# Patient Record
Sex: Female | Born: 1970 | Race: White | Hispanic: No | Marital: Married | Smoking: Never smoker
Health system: Southern US, Community
[De-identification: ages and names within clinical notes are randomized; demographics above are authoritative.]

## PROBLEM LIST (undated history)

## (undated) DIAGNOSIS — Z87442 Personal history of urinary calculi: Secondary | ICD-10-CM

## (undated) HISTORY — PX: CYSTOSCOPY W/ URETEROSCOPY: SUR379

---

## 1997-08-18 ENCOUNTER — Inpatient Hospital Stay (HOSPITAL_COMMUNITY): Admission: AD | Admit: 1997-08-18 | Discharge: 1997-08-20 | Payer: Self-pay | Admitting: Obstetrics and Gynecology

## 1997-10-08 ENCOUNTER — Other Ambulatory Visit: Admission: RE | Admit: 1997-10-08 | Discharge: 1997-10-08 | Payer: Self-pay | Admitting: *Deleted

## 1999-08-06 ENCOUNTER — Other Ambulatory Visit: Admission: RE | Admit: 1999-08-06 | Discharge: 1999-08-06 | Payer: Self-pay | Admitting: Obstetrics and Gynecology

## 2000-03-01 ENCOUNTER — Inpatient Hospital Stay (HOSPITAL_COMMUNITY): Admission: AD | Admit: 2000-03-01 | Discharge: 2000-03-03 | Payer: Self-pay | Admitting: Obstetrics and Gynecology

## 2000-04-10 ENCOUNTER — Other Ambulatory Visit: Admission: RE | Admit: 2000-04-10 | Discharge: 2000-04-10 | Payer: Self-pay | Admitting: Obstetrics and Gynecology

## 2001-06-18 ENCOUNTER — Other Ambulatory Visit: Admission: RE | Admit: 2001-06-18 | Discharge: 2001-06-18 | Payer: Self-pay | Admitting: Obstetrics and Gynecology

## 2002-08-05 ENCOUNTER — Emergency Department (HOSPITAL_COMMUNITY): Admission: EM | Admit: 2002-08-05 | Discharge: 2002-08-05 | Payer: Self-pay | Admitting: Emergency Medicine

## 2002-08-05 ENCOUNTER — Encounter: Payer: Self-pay | Admitting: Emergency Medicine

## 2002-08-07 ENCOUNTER — Emergency Department (HOSPITAL_COMMUNITY): Admission: EM | Admit: 2002-08-07 | Discharge: 2002-08-07 | Payer: Self-pay | Admitting: Emergency Medicine

## 2002-08-13 ENCOUNTER — Encounter: Payer: Self-pay | Admitting: Urology

## 2002-08-13 ENCOUNTER — Ambulatory Visit (HOSPITAL_COMMUNITY): Admission: RE | Admit: 2002-08-13 | Discharge: 2002-08-13 | Payer: Self-pay | Admitting: Urology

## 2002-12-25 ENCOUNTER — Other Ambulatory Visit: Admission: RE | Admit: 2002-12-25 | Discharge: 2002-12-25 | Payer: Self-pay | Admitting: Obstetrics and Gynecology

## 2004-07-30 ENCOUNTER — Other Ambulatory Visit: Admission: RE | Admit: 2004-07-30 | Discharge: 2004-07-30 | Payer: Self-pay | Admitting: Obstetrics and Gynecology

## 2009-03-07 HISTORY — PX: EXTRACORPOREAL SHOCK WAVE LITHOTRIPSY: SHX1557

## 2009-09-23 ENCOUNTER — Encounter: Admission: RE | Admit: 2009-09-23 | Discharge: 2009-09-23 | Payer: Self-pay | Admitting: Obstetrics and Gynecology

## 2010-07-23 NOTE — Op Note (Signed)
NAME:  Stephanie Mcclure, Stephanie Mcclure                         ACCOUNT NO.:  [id number]   MEDICAL RECORD NO.:  [id number]                   PATIENT TYPE:  AMB   LOCATION:  DAY                                  FACILITY:  [facility]   PHYSICIAN:  Excell Seltzer. Annabell Howells, M.D.                 DATE OF BIRTH:  [birth year]   DATE OF PROCEDURE:  08/13/2002  DATE OF DISCHARGE:                                 OPERATIVE REPORT   PREOPERATIVE DIAGNOSIS:  Left ureterovesical junction stone.   POSTOPERATIVE DIAGNOSIS:  Left ureterovesical junction stone.   PROCEDURES:  Cystoscopy with left retrograde pyelogram with interpretation  and left ureteroscopic stent extraction.   SURGEON:  Excell Seltzer. Annabell Howells, M.D.   ANESTHESIA:  General.   SPECIMENS:  Stone.   COMPLICATIONS:  None.   INDICATIONS:  [name] is a 40 year old white female with a 6 mm left distal  ureteral stone that has failed to pass.  She has had persistent pain and has  elected ureteroscopic removal.   FINDINGS AND PROCEDURE:  The patient was taken to the operating room, where  a general anesthetic was induced.  She had been given p.o. antibiotics  preoperatively.  She was placed in the lithotomy position, her perineum and  genitalia were prepped with Betadine solution, and she was draped in the  usual sterile fashion.  Cystoscopy was performed using a 22 [place] scope and  a 70 degree lens.  Examination revealed a normal bladder wall.  The ureteral  orifices were unremarkable.  The 12 degree lens was then placed along with a  5 French opening catheter.  Contrast was instilled in a retrograde fashion.  This demonstrated persistent calcification in the distal ureter on the left.  I had originally not been able to see it on fluoroscopy, but it was readily  visible on the retrograde.  There was some mild dilation proximal.  I then  removed the cystoscope and passed a 6 French long ureteroscope.  This was  easily advanced through the ureteral orifice without dilation.   The stone  was visualized, grasped with a nitinol basket, and removed without  difficulty.  Reinspection of the ureter after removal of the stone revealed  no significant trauma or inflammatory changes.  The bladder was then  drained.  The patient was taken down from lithotomy position.  She was given  30 mg of Toradol IV and her anesthetic was reversed.  She was moved to the  recovery room in stable condition.  There were no complications.                                                Excell Seltzer. Annabell Howells, M.D.    JJW/MEDQ  D:  08/13/2002  T:  08/13/2002  Job:  [id number]

## 2011-08-02 ENCOUNTER — Encounter (HOSPITAL_COMMUNITY): Payer: Self-pay

## 2011-08-02 ENCOUNTER — Emergency Department (HOSPITAL_COMMUNITY): Payer: BC Managed Care – PPO

## 2011-08-02 ENCOUNTER — Emergency Department (HOSPITAL_COMMUNITY)
Admission: EM | Admit: 2011-08-02 | Discharge: 2011-08-03 | Disposition: A | Discharge status: Home / Self Care | Payer: BC Managed Care – PPO | Attending: Emergency Medicine | Admitting: Emergency Medicine

## 2011-08-02 DIAGNOSIS — N201 Calculus of ureter: Secondary | ICD-10-CM | POA: Insufficient documentation

## 2011-08-02 DIAGNOSIS — R109 Unspecified abdominal pain: Secondary | ICD-10-CM | POA: Insufficient documentation

## 2011-08-02 DIAGNOSIS — N200 Calculus of kidney: Secondary | ICD-10-CM | POA: Insufficient documentation

## 2011-08-02 LAB — URINALYSIS, ROUTINE W REFLEX MICROSCOPIC
Bilirubin Urine: NEGATIVE
Nitrite: NEGATIVE
Protein, ur: NEGATIVE mg/dL
Urobilinogen, UA: 0.2 mg/dL (ref 0.0–1.0)

## 2011-08-02 LAB — URINE MICROSCOPIC-ADD ON

## 2011-08-02 MED ORDER — ONDANSETRON HCL 4 MG/2ML IJ SOLN
4.0000 mg | Freq: Once | INTRAMUSCULAR | Status: AC
  Administered 2011-08-03: 4 mg via INTRAVENOUS
  Filled 2011-08-02: qty 2

## 2011-08-02 MED ORDER — HYDROMORPHONE HCL PF 1 MG/ML IJ SOLN
1.0000 mg | Freq: Once | INTRAMUSCULAR | Status: AC
  Administered 2011-08-03: 1 mg via INTRAVENOUS
  Filled 2011-08-02: qty 1

## 2011-08-02 MED ORDER — SODIUM CHLORIDE 0.9 % IV SOLN
INTRAVENOUS | Status: DC
  Administered 2011-08-03: via INTRAVENOUS

## 2011-08-02 NOTE — ED Notes (Signed)
Pt had acute pain on her left side, no hematuria, hx of kidney stones 10 years ago

## 2011-08-03 MED ORDER — ONDANSETRON HCL 4 MG PO TABS: 4.0000 mg | ORAL_TABLET | Freq: Four times a day (QID) | ORAL | Status: AC

## 2011-08-03 MED ORDER — OXYCODONE-ACETAMINOPHEN 5-325 MG PO TABS: 2.0000 | ORAL_TABLET | ORAL | Status: AC | PRN

## 2011-08-03 NOTE — ED Notes (Signed)
Nadn.  Pt aaox3.  Pt cverbalizes understanding.  Pt ambulate without difficulty

## 2011-08-03 NOTE — Discharge Instructions (Signed)
Kidney Stones Kidney stones (ureteral lithiasis) are deposits that form inside your kidneys. The intense pain is caused by the stone moving through the urinary tract. When the stone moves, the ureter goes into spasm around the stone. The stone is usually passed in the urine.  CAUSES   A disorder that makes certain neck glands produce too much parathyroid hormone (primary hyperparathyroidism).   A buildup of uric acid crystals.   Narrowing (stricture) of the ureter.   A kidney obstruction present at birth (congenital obstruction).   Previous surgery on the kidney or ureters.   Numerous kidney infections.  SYMPTOMS   Feeling sick to your stomach (nauseous).   Throwing up (vomiting).   Blood in the urine (hematuria).   Pain that usually spreads (radiates) to the groin.   Frequency or urgency of urination.  DIAGNOSIS   Taking a history and physical exam.   Blood or urine tests.   Computerized X-ray scan (CT scan).   Occasionally, an examination of the inside of the urinary bladder (cystoscopy) is performed.  TREATMENT   Observation.   Increasing your fluid intake.   Surgery may be needed if you have severe pain or persistent obstruction.  The size, location, and chemical composition are all important variables that will determine the proper choice of action for you. Talk to your caregiver to better understand your situation so that you will minimize the risk of injury to yourself and your kidney.  HOME CARE INSTRUCTIONS   Drink enough water and fluids to keep your urine clear or pale yellow.   Strain all urine through the provided strainer. Keep all particulate matter and stones for your caregiver to see. The stone causing the pain may be as small as a grain of salt. It is very important to use the strainer each and every time you pass your urine. The collection of your stone will allow your caregiver to analyze it and verify that a stone has actually passed.   Only take  over-the-counter or prescription medicines for pain, discomfort, or fever as directed by your caregiver.   Make a follow-up appointment with your caregiver as directed.   Get follow-up X-rays if required. The absence of pain does not always mean that the stone has passed. It may have only stopped moving. If the urine remains completely obstructed, it can cause loss of kidney function or even complete destruction of the kidney. It is your responsibility to make sure X-rays and follow-ups are completed. Ultrasounds of the kidney can show blockages and the status of the kidney. Ultrasounds are not associated with any radiation and can be performed easily in a matter of minutes.  SEEK IMMEDIATE MEDICAL CARE IF:   Pain cannot be controlled with the prescribed medicine.   You have a fever.   The severity or intensity of pain increases over 18 hours and is not relieved by pain medicine.   You develop a new onset of abdominal pain.   You feel faint or pass out.  MAKE SURE YOU:   Understand these instructions.   Will watch your condition.

## 2011-08-03 NOTE — ED Provider Notes (Signed)
History     CSN: [id number]  Arrival date & time 08/02/11  1941   First MD Initiated Contact with Patient 08/02/11 2324      Chief Complaint  Patient presents with  . Flank Pain    (Consider location/radiation/quality/duration/timing/severity/associated sxs/prior treatment) HPI History provided by patient. Left flank pain onset tonight. Sudden onset severe pain. No radiation. Sharp in quality. No hematuria. History of same years ago requiring surgery for kidney stone. No trauma. No rash. No chest pain or shortness of breath. No abdominal pain otherwise. Pain lasted about an hour and by the time she arrives to the emergency department is resolved. No known aggravating or alleviating factors. History reviewed. No pertinent past medical history.  History reviewed. No pertinent past surgical history.  History reviewed. No pertinent family history.  History  Substance Use Topics  . Smoking status: Not on file  . Smokeless tobacco: Not on file  . Alcohol Use: No    OB History    Grav Para Term Preterm Abortions TAB SAB Ect Mult Living                  Review of Systems  Constitutional: Negative for fever and chills.  HENT: Negative for neck pain and neck stiffness.   Eyes: Negative for pain.  Respiratory: Negative for shortness of breath.   Cardiovascular: Negative for chest pain.  Gastrointestinal: Negative for abdominal pain.  Genitourinary: Positive for flank pain. Negative for dysuria.  Musculoskeletal: Negative for back pain.  Skin: Negative for rash.  Neurological: Negative for headaches.  All other systems reviewed and are negative.    Allergies  Review of patient's allergies indicates no known allergies.  Home Medications  No current outpatient prescriptions on file.  BP 107/70  Pulse 81  Temp(Src) 98.4 F (36.9 C) (Oral)  Resp 20  Wt 135 lb (61.236 kg)  SpO2 100%  Physical Exam  Constitutional: She is oriented to person, place, and time. She  appears well-developed and well-nourished.  HENT:  Head: Normocephalic and atraumatic.  Eyes: Conjunctivae and EOM are normal. Pupils are equal, round, and reactive to light.  Neck: Trachea normal. Neck supple. No thyromegaly present.  Cardiovascular: Normal rate, regular rhythm, S1 normal, S2 normal and normal pulses.     No systolic murmur is present   No diastolic murmur is present  Pulses:      Radial pulses are 2+ on the right side, and 2+ on the left side.  Pulmonary/Chest: Effort normal and breath sounds normal. She has no wheezes. She has no rhonchi. She has no rales. She exhibits no tenderness.  Abdominal: Soft. Normal appearance and bowel sounds are normal. There is no tenderness. There is no CVA tenderness and negative Murphy's sign.       Localizes discomfort to left flank area without reproducible tenderness  Musculoskeletal:       BLE:s Calves nontender, no cords or erythema, negative Homans sign  Neurological: She is alert and oriented to person, place, and time. She has normal strength. No cranial nerve deficit or sensory deficit. GCS eye subscore is 4. GCS verbal subscore is 5. GCS motor subscore is 6.  Skin: Skin is warm and dry. No rash noted. She is not diaphoretic.  Psychiatric: Her speech is normal.       Cooperative and appropriate    ED Course  Procedures (including critical care time)  Labs Reviewed  URINALYSIS, ROUTINE W REFLEX MICROSCOPIC - Abnormal; Notable for the following:  APPearance CLOUDY (*)    Hgb urine dipstick LARGE (*)    Ketones, ur TRACE (*)    Leukocytes, UA SMALL (*)    All other components within normal limits  URINE MICROSCOPIC-ADD ON  PREGNANCY, URINE   Ct Abdomen Pelvis Wo Contrast  08/03/2011  *RADIOLOGY REPORT*  Clinical Data: Acute left flank pain.  No hematuria.  CT ABDOMEN AND PELVIS WITHOUT CONTRAST  Technique:  Multidetector CT imaging of the abdomen and pelvis was performed following the standard protocol without intravenous  contrast.  Comparison: None.  Findings: The lung bases are clear.  There is a 4 mm stone in the proximal left ureter with proximal pyelocaliectasis and periureteral stranding consistent with moderate obstruction.  The distal ureter is decompressed.  No bladder stones are visualized.  No right ureteral stones are visualized.  There are bilateral intrarenal stones, with a single stone in the right lower pole measuring 5 mm diameter and multiple intrarenal stones on the left, largest in the upper pole measuring 9 mm diameter.  The unenhanced appearance of the liver, spleen, gallbladder, pancreas, adrenal glands, abdominal aorta, and retroperitoneal lymph nodes is unremarkable.  The stomach and small bowel are decompressed.  Stool filled colon without distension.  No free air or free fluid in the abdomen.  Pelvis:  The appendix is normal.  Intrauterine device present. Uterus and adnexal structures are not enlarged.  No free or loculated pelvic fluid collections.  No bladder wall thickening. No significant pelvic lymphadenopathy.  IMPRESSION: 5 mm stone in the proximal left ureter with moderate proximal obstruction.  Bilateral intrarenal stones are nonobstructing.  Original Report Authenticated By: Marlon Pel, M.D.    UA and CT reviewed as above. She began having some symptoms and medications provided for pain.   MDM   Left sided proximal ureterolithiasis with history of same. Stable for discharge home. No UTI. Urology referral provided with prescription for pain medications and antiemetics as needed. Reliable historian verbalizes understanding strict return precautions for any worsening condition.        Sunnie Nielsen, MD 08/04/11 [phone]

## 2011-08-03 NOTE — ED Notes (Signed)
Pt c/o LLQ pain onset today @ 1530, pain gradually increased then radiated to L flank. Pt states pain then stopped @ 2030 tonight while in [place]. Pt pain free at this time. Husband at bedside.

## 2011-08-03 NOTE — ED Notes (Signed)
Pt continues to be pain free, IVF infusing, MD aware CT resulted.

## 2011-08-04 ENCOUNTER — Other Ambulatory Visit: Payer: Self-pay | Admitting: Urology

## 2011-08-12 ENCOUNTER — Encounter (HOSPITAL_COMMUNITY): Payer: Self-pay | Admitting: *Deleted

## 2011-08-12 NOTE — H&P (Signed)
History of Present Illness   Stephanie Mcclure presents today to reestablish as a new patient here. She was seen and treated by Dr. Annabell Howells approximately 9 years ago with a distal ureteral stone. That was removed via ureteroscopy and was primarily calcium phosphate with a mixture of calcium oxalate. She does have a family history of nephrolithiasis. She has had no recurrences until recently. Approximately 48 hours ago she did develop some left-sided renal colic. She was noted to have a 4 mm proximal left ureteral stone with mild to moderate obstruction. She also had significant bilateral renal calculi noted. This included a 9 mm upper pole stone. She has actually done quite well clinically but is concerned about through additional episodes of severe pain and wants to know what her options are. No real voiding complaints or other systemic concerns at this time.       Past Medical History Problems  1. History of  No Medical Problems  Surgical History Problems  1. History of  Cystoscopy With Ureteroscopy With Removal Of Calculus  Current Meds 1. No Reported Medications  Allergies Medication  1. No Known Drug Allergies  Family History Problems  1. Paternal history of  Family Health Status - Father's Age 79yrs 2. Maternal history of  Family Health Status - Mother's Age 82yrs 3. Family history of  Family Health Status Number Of Children 2 daughter 4. 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  Gastrointestinal: abdominal pain.      Vitals Vital Signs [Data Includes: Last 1 Day]  30May2013 09:23AM  BMI Calculated: 22.82 BSA Calculated: 1.68 Height: 5 ft 5 in Weight: 137 lb  Blood Pressure: 112 / 77 Temperature: 97.2 F Heart Rate: 72  Physical Exam Constitutional: Well nourished and well developed . No acute distress.  ENT:. The ears and nose are normal in appearance.  Neck: The appearance of the neck is normal and no neck mass is present.  Pulmonary: No respiratory distress and normal respiratory rhythm and effort.  Cardiovascular: Heart rate and rhythm are normal . No peripheral edema.  Abdomen: The abdomen is soft and nontender. No masses are palpated. No CVA tenderness. No hernias are palpable. No hepatosplenomegaly noted.  Skin: Normal skin turgor, no visible rash and no visible skin lesions.  Neuro/Psych:. Mood and affect are appropriate.    Results/Data Urine [Data Includes: Last 1 Day]   30May2013  COLOR STRAW   APPEARANCE CLEAR   SPECIFIC GRAVITY <1.005   pH 6.0   GLUCOSE NEG mg/dL  BILIRUBIN NEG   KETONE NEG mg/dL  BLOOD LARGE   PROTEIN NEG mg/dL  UROBILINOGEN 0.2 mg/dL  NITRITE NEG   LEUKOCYTE ESTERASE NEG   SQUAMOUS EPITHELIAL/HPF NONE SEEN   WBC NONE SEEN WBC/hpf  RBC NONE SEEN RBC/hpf  BACTERIA NONE SEEN   CRYSTALS NONE SEEN   CASTS NONE SEEN     KUB was obtained today. One can clearly see two fairly dense stones in the upper pole of the left kidney measuring 7 and 5 mm respectively. There are some more faint calcifications in the lower kidney as well corresponding  to the previous CT imaging. I see nothing obvious on the right side. There is a very faint calcification in the area of the proximal left ureter that does seem to correspond to a previous seen stone, but visualization is difficult.    Assessment Assessed  1. Ureteral Stone 592.1 2. Nephrolithiasis 592.0  Plan Health Maintenance (V70.0)  1. UA With REFLEX  Done: 30May2013 09:15AM Nephrolithiasis (592.0)  2. KUB   Done: 30May2013 12:00AM 3. Follow-up Schedule Surgery Office  Follow-up  Requested for: 30May2013  Discussion/Summary   Clinically Stephanie Mcclure is doing well at this time. She was diagnosed with a 4-5 mm proximal left ureteral stone. She was told in the ER that she probably not pass the stone, but certainly there is at least a 50% chance that she could. She is not terribly interested in giving this a long period of time to pass due to lots of upcoming events and work issues. There certainly is no urgent need for intervention. Obviously, given the proximal nature of the stone, I would prefer lithotripsy over ureteroscopy if the stone were to stay in the same position. The stone, however, is difficult to visualize on KUB and Shereese knows that if we do perform lithotripsy and are unable to visualize the stone, that the surgery/procedure may need to be canceled. We certainly could consider some intravenous contrast to try to localize the stone. At this point she is interested in being put non-urgently on the lithotripsy schedule. This will give her some time to try to pass the stone spontaneously. If her clinical situation worsens and she has much more excruciating discomfort, then we may have to change depending upon availability of the lithotripter. Hopefully we will be able to definitely take care of this problem for her. We would be delighted to cancel the procedure if the stone does indeed pass. We will talk later about the possibility of doing some additional metabolic studies on her given her stone burden and also elective nature of treating the upper pole calcifications.    Signatures Electronically signed by : Barron Alvine, M.D.; Aug 04 2011 12:11PM

## 2011-08-12 NOTE — Progress Notes (Signed)
Asked to bring blue folder,insurance card ,ID,eat a light dinner,take a laxative between 5 pm amd 6 pm Sunday, NPO after MN  08-14-11 Understood all instructions given

## 2011-08-15 ENCOUNTER — Encounter (HOSPITAL_COMMUNITY)
Admission: RE | Disposition: A | Discharge status: Home / Self Care | Payer: Self-pay | Source: Ambulatory Visit | Attending: Urology

## 2011-08-15 ENCOUNTER — Ambulatory Visit (HOSPITAL_COMMUNITY): Payer: BC Managed Care – PPO

## 2011-08-15 ENCOUNTER — Ambulatory Visit (HOSPITAL_COMMUNITY)
Admission: RE | Admit: 2011-08-15 | Discharge: 2011-08-15 | Disposition: A | Discharge status: Home / Self Care | Payer: BC Managed Care – PPO | Source: Ambulatory Visit | Attending: Urology | Admitting: Urology

## 2011-08-15 ENCOUNTER — Encounter (HOSPITAL_COMMUNITY): Payer: Self-pay | Admitting: *Deleted

## 2011-08-15 DIAGNOSIS — N200 Calculus of kidney: Secondary | ICD-10-CM | POA: Insufficient documentation

## 2011-08-15 DIAGNOSIS — N201 Calculus of ureter: Secondary | ICD-10-CM

## 2011-08-15 LAB — PREGNANCY, URINE: Preg Test, Ur: NEGATIVE

## 2011-08-15 SURGERY — LITHOTRIPSY, ESWL
Anesthesia: LOCAL | Laterality: Left

## 2011-08-15 MED ORDER — CIPROFLOXACIN IN D5W 400 MG/200ML IV SOLN
INTRAVENOUS | Status: AC
  Administered 2011-08-15: 400 mg via INTRAVENOUS
  Filled 2011-08-15: qty 200

## 2011-08-15 MED ORDER — DIAZEPAM 5 MG PO TABS
ORAL_TABLET | ORAL | Status: AC
  Administered 2011-08-15: 10 mg via ORAL
  Filled 2011-08-15: qty 2

## 2011-08-15 MED ORDER — DIPHENHYDRAMINE HCL 25 MG PO CAPS
25.0000 mg | ORAL_CAPSULE | ORAL | Status: AC
  Administered 2011-08-15: 25 mg via ORAL

## 2011-08-15 MED ORDER — DIAZEPAM 5 MG PO TABS
10.0000 mg | ORAL_TABLET | ORAL | Status: AC
  Administered 2011-08-15: 10 mg via ORAL

## 2011-08-15 MED ORDER — CIPROFLOXACIN IN D5W 400 MG/200ML IV SOLN
400.0000 mg | INTRAVENOUS | Status: AC
  Administered 2011-08-15: 400 mg via INTRAVENOUS

## 2011-08-15 MED ORDER — DIPHENHYDRAMINE HCL 25 MG PO CAPS
ORAL_CAPSULE | ORAL | Status: AC
  Administered 2011-08-15: 25 mg via ORAL
  Filled 2011-08-15: qty 1

## 2011-08-15 MED ORDER — DEXTROSE-NACL 5-0.45 % IV SOLN
INTRAVENOUS | Status: DC
  Administered 2011-08-15: 09:00:00 via INTRAVENOUS

## 2011-08-15 NOTE — Discharge Instructions (Signed)
See [facility] discharge instructions in chart.  

## 2011-08-15 NOTE — Interval H&P Note (Signed)
History and Physical Interval Note:  08/15/2011 10:40 AM  Stephanie Mcclure  has presented today for surgery, with the diagnosis of LEFT PROXIMAL URETERAL CALCULUS  The various methods of treatment have been discussed with the patient and family. After consideration of risks, benefits and other options for treatment, the patient has consented to  Procedure(s) (LRB): EXTRACORPOREAL SHOCK WAVE LITHOTRIPSY (ESWL) (Left) as a surgical intervention .  The patients' history has been reviewed, patient examined, no change in status, stable for surgery.  I have reviewed the patients' chart and labs.  Questions were answered to the patient's satisfaction.     Lorma Heater S

## 2011-08-15 NOTE — Op Note (Signed)
See [facility] OP note scanned into chart. 

## 2013-06-04 IMAGING — CT CT ABD-PELV W/O CM
1 series · 15 of 23 positions shown, 19 images · non-contrast
Comparison: None.

CLINICAL DATA: Acute left flank pain.  No hematuria.

CT ABDOMEN AND PELVIS WITHOUT CONTRAST
TECHNIQUE: Multidetector CT imaging of the abdomen and pelvis was
performed following the standard protocol without intravenous
contrast.

[Series 4: lung · axial · 0.66mm/px · z∈[+1093,+1193]mm · 15 of 23 slices shown, 19 images]
[im 2/23  soft-tissue]
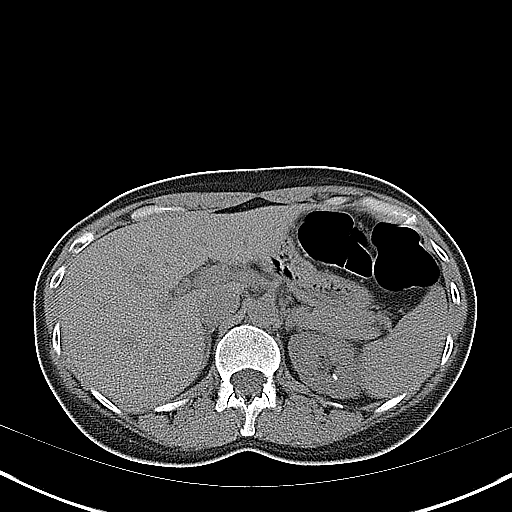
[im 2/23  bone]
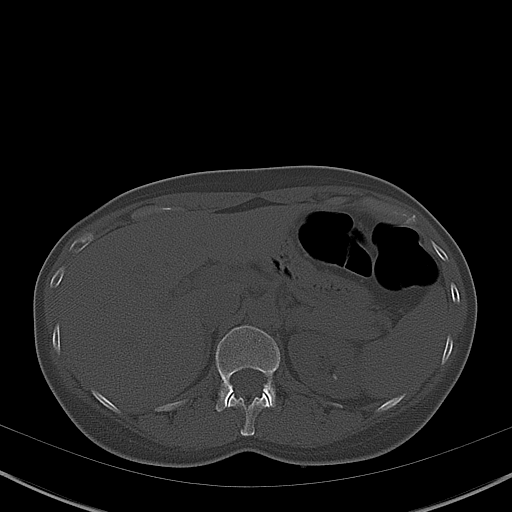
[im 4/23  soft-tissue]
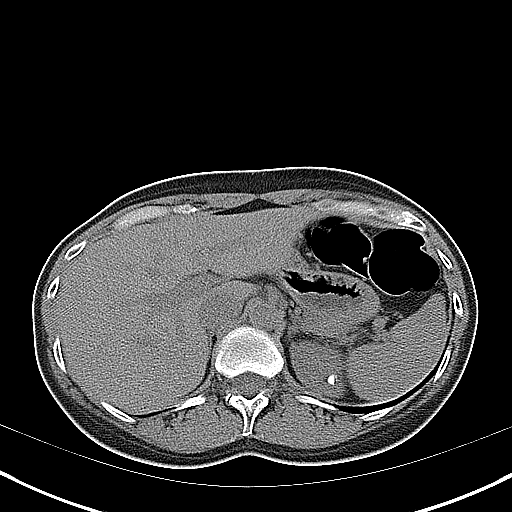
[im 6/23  soft-tissue]
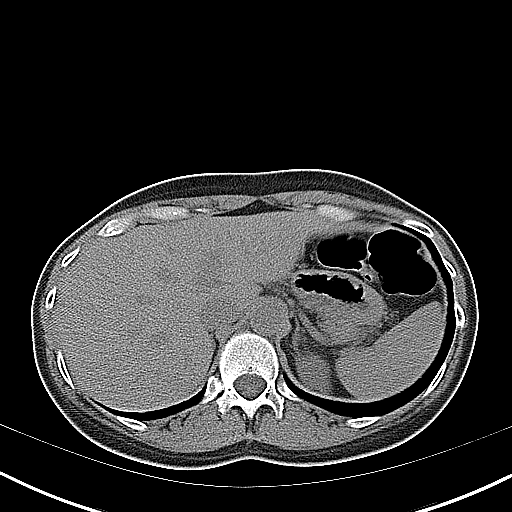
[im 7/23  soft-tissue]
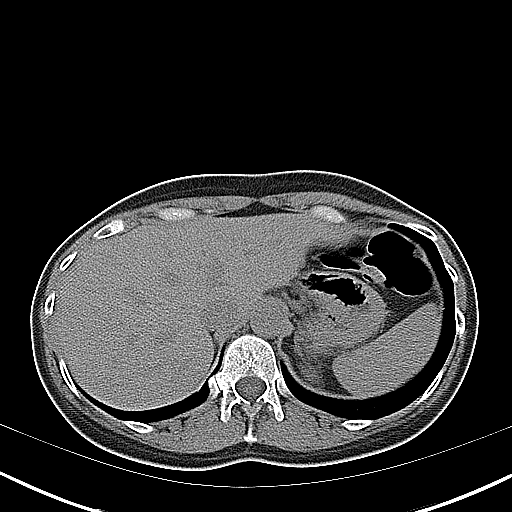
[im 9/23  soft-tissue]
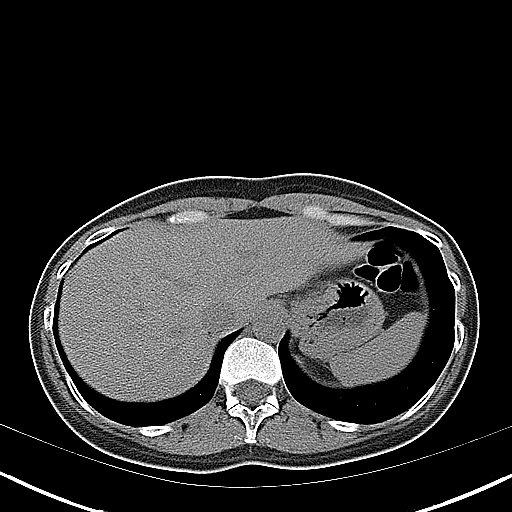
[im 10/23  soft-tissue]
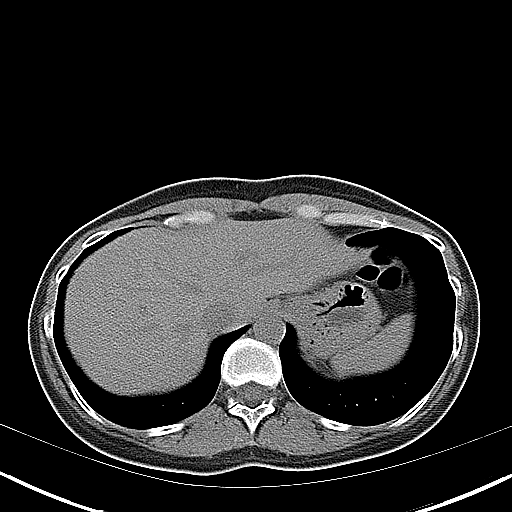
[im 12/23  soft-tissue]
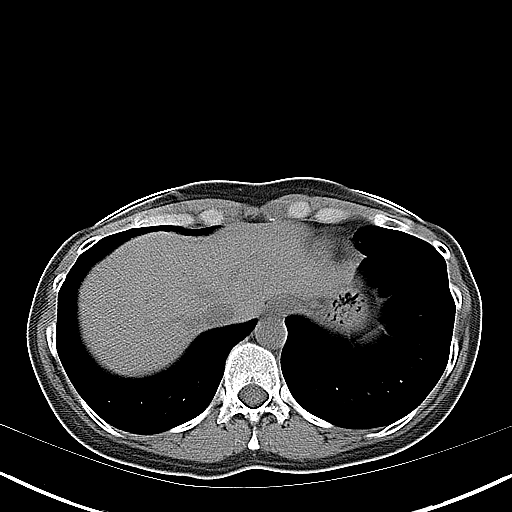
[im 14/23  soft-tissue]
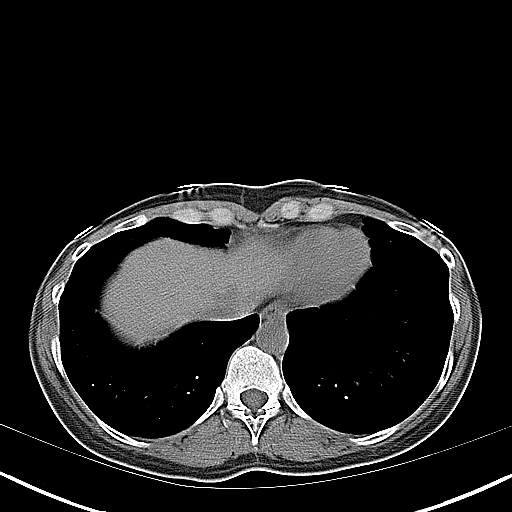
[im 15/23  soft-tissue]
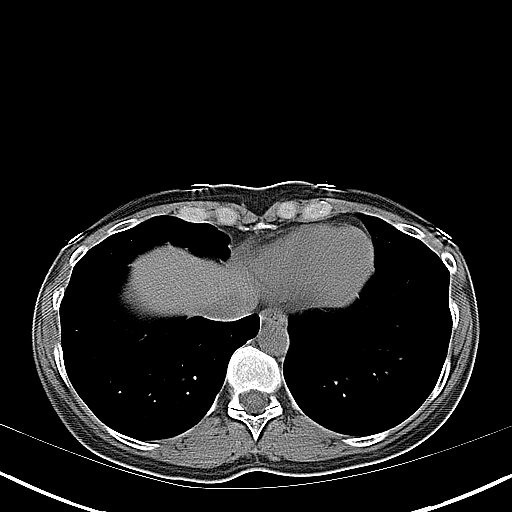
[im 15/23  bone]
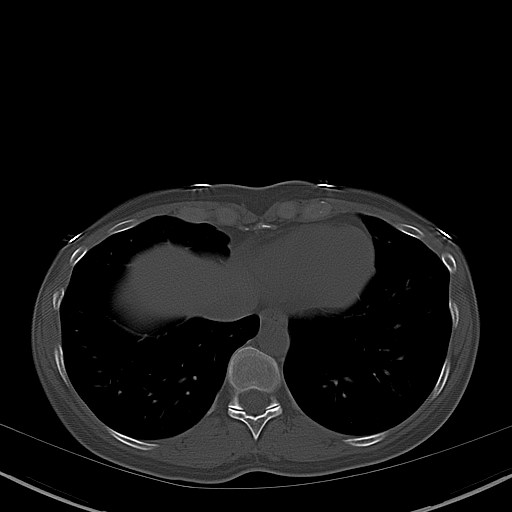
[im 17/23  soft-tissue]
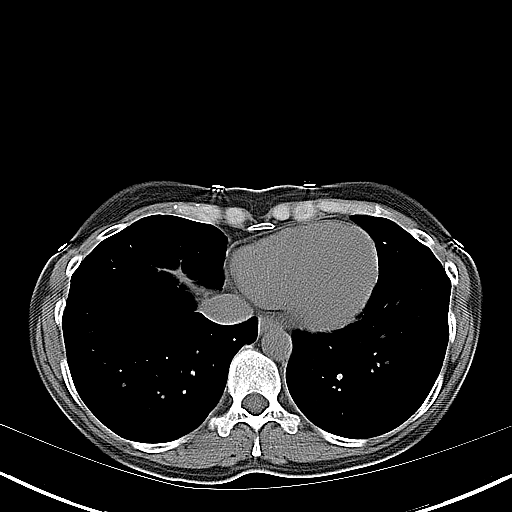
[im 18/23  soft-tissue]
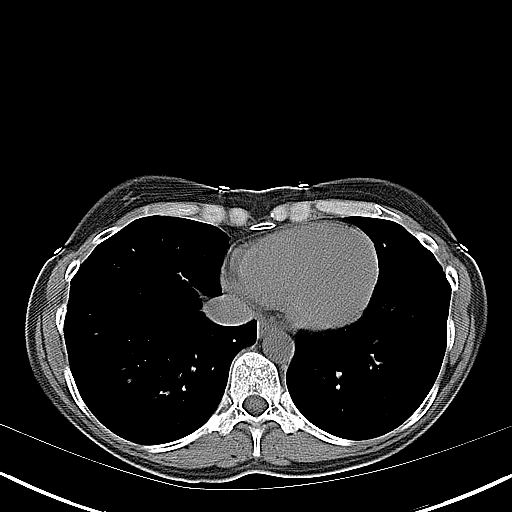
[im 19/23  lung]
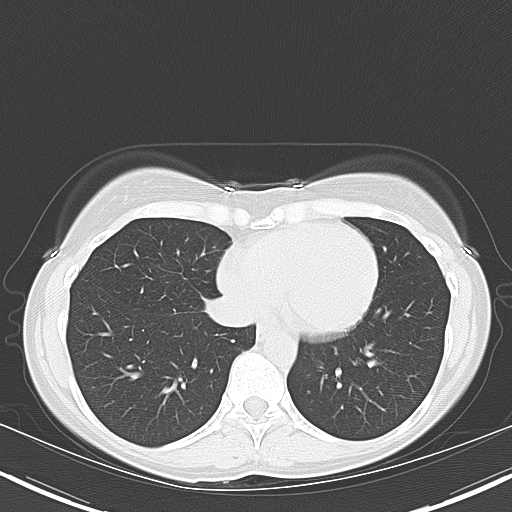
[im 20/23  soft-tissue]
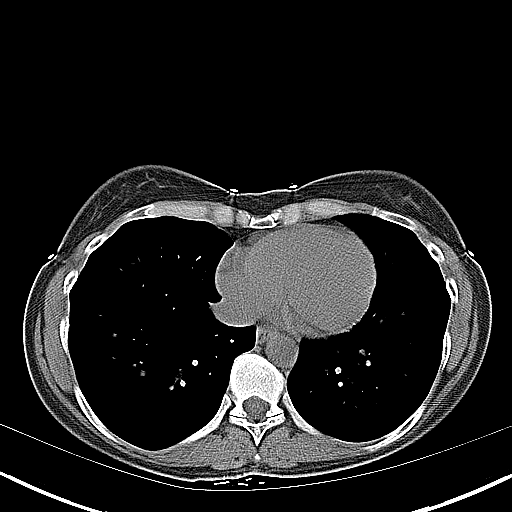
[im 20/23  lung]
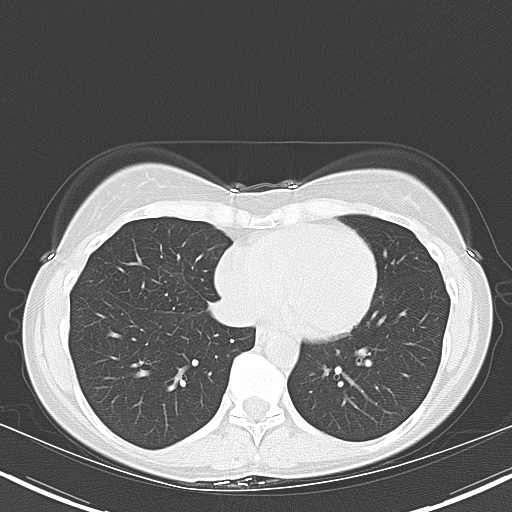
[im 21/23  lung]
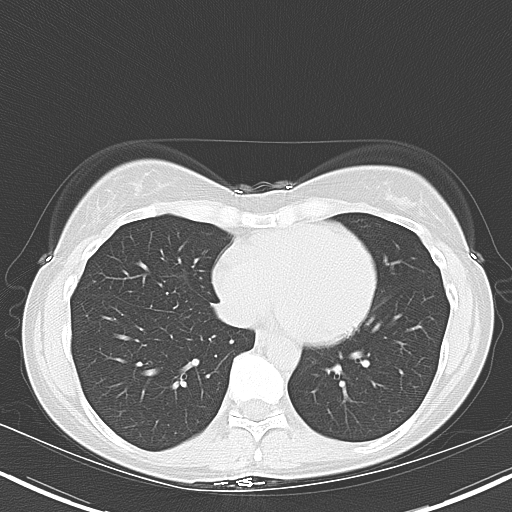
[im 22/23  soft-tissue]
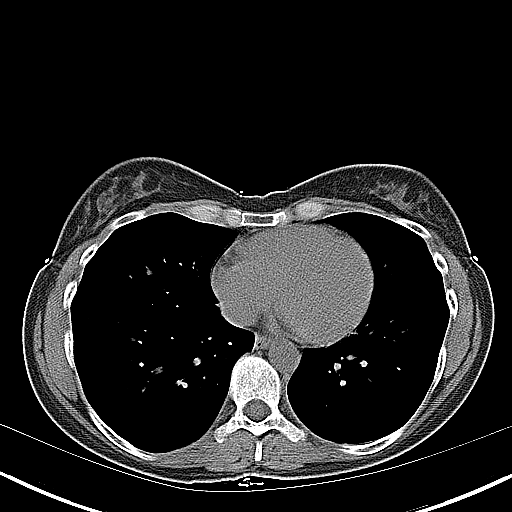
[im 22/23  lung]
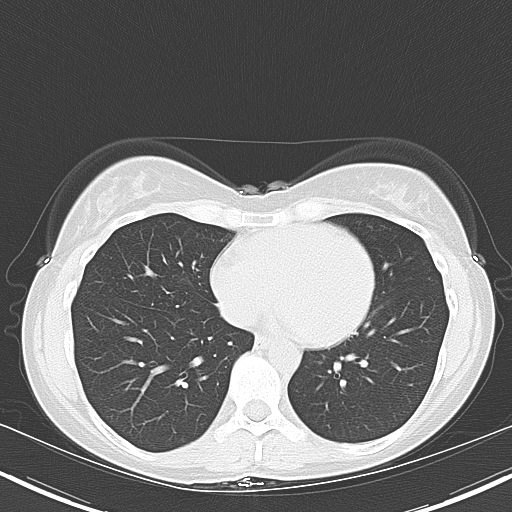

[15 of 23 positions shown; findings below may reference images not displayed]

FINDINGS: The lung bases are clear.

There is a 4 mm stone in the proximal left ureter with proximal
pyelocaliectasis and periureteral stranding consistent with
moderate obstruction.  The distal ureter is decompressed.  No
bladder stones are visualized.  No right ureteral stones are
visualized.  There are bilateral intrarenal stones, with a single
stone in the right lower pole measuring 5 mm diameter and multiple
intrarenal stones on the left, largest in the upper pole measuring
9 mm diameter.

The unenhanced appearance of the liver, spleen, gallbladder,
pancreas, adrenal glands, abdominal aorta, and retroperitoneal
lymph nodes is unremarkable.  The stomach and small bowel are
decompressed.  Stool filled colon without distension.  No free air
or free fluid in the abdomen.

Pelvis:  The appendix is normal.  Intrauterine device present.
Uterus and adnexal structures are not enlarged.  No free or
loculated pelvic fluid collections.  No bladder wall thickening.
No significant pelvic lymphadenopathy.
IMPRESSION: 5 mm stone in the proximal left ureter with moderate proximal
obstruction.  Bilateral intrarenal stones are nonobstructing.

## 2017-05-26 ENCOUNTER — Encounter (HOSPITAL_COMMUNITY): Payer: Self-pay | Admitting: Emergency Medicine

## 2017-05-26 ENCOUNTER — Emergency Department (HOSPITAL_COMMUNITY)
Admission: EM | Admit: 2017-05-26 | Discharge: 2017-05-27 | Disposition: A | Discharge status: Home / Self Care | Payer: 59 | Attending: Emergency Medicine | Admitting: Emergency Medicine

## 2017-05-26 DIAGNOSIS — N189 Chronic kidney disease, unspecified: Secondary | ICD-10-CM | POA: Insufficient documentation

## 2017-05-26 DIAGNOSIS — R109 Unspecified abdominal pain: Secondary | ICD-10-CM | POA: Diagnosis present

## 2017-05-26 DIAGNOSIS — N23 Unspecified renal colic: Secondary | ICD-10-CM

## 2017-05-26 LAB — POC URINE PREG, ED: Preg Test, Ur: NEGATIVE

## 2017-05-26 MED ORDER — KETOROLAC TROMETHAMINE 30 MG/ML IJ SOLN
15.0000 mg | Freq: Once | INTRAMUSCULAR | Status: AC
  Administered 2017-05-26: 15 mg via INTRAVENOUS
  Filled 2017-05-26: qty 1

## 2017-05-26 MED ORDER — SODIUM CHLORIDE 0.9 % IV BOLUS (SEPSIS)
500.0000 mL | Freq: Once | INTRAVENOUS | Status: AC
  Administered 2017-05-26: 500 mL via INTRAVENOUS

## 2017-05-26 MED ORDER — ONDANSETRON HCL 4 MG/2ML IJ SOLN
4.0000 mg | Freq: Once | INTRAMUSCULAR | Status: AC
  Administered 2017-05-26: 4 mg via INTRAVENOUS
  Filled 2017-05-26: qty 2

## 2017-05-26 NOTE — ED Triage Notes (Signed)
Patient c/o left side flank pain with N/V worsening since yesterday. Hx kidney stones. Reports taking hydrocodone at home with no relief. Last dose at 1830.

## 2017-05-27 ENCOUNTER — Emergency Department (HOSPITAL_COMMUNITY): Payer: 59

## 2017-05-27 LAB — URINALYSIS, ROUTINE W REFLEX MICROSCOPIC
BILIRUBIN URINE: NEGATIVE
Bacteria, UA: NONE SEEN
Glucose, UA: NEGATIVE mg/dL
Ketones, ur: 80 mg/dL — AB
LEUKOCYTES UA: NEGATIVE
Nitrite: NEGATIVE
PH: 6 (ref 5.0–8.0)
Protein, ur: NEGATIVE mg/dL
SPECIFIC GRAVITY, URINE: 1.018 (ref 1.005–1.030)

## 2017-05-27 LAB — I-STAT CHEM 8, ED
BUN: 11 mg/dL (ref 6–20)
CALCIUM ION: 1.23 mmol/L (ref 1.15–1.40)
CREATININE: 1.2 mg/dL — AB (ref 0.44–1.00)
Chloride: 99 mmol/L — ABNORMAL LOW (ref 101–111)
GLUCOSE: 157 mg/dL — AB (ref 65–99)
HCT: 44 % (ref 36.0–46.0)
Hemoglobin: 15 g/dL (ref 12.0–15.0)
Potassium: 3.7 mmol/L (ref 3.5–5.1)
SODIUM: 136 mmol/L (ref 135–145)
TCO2: 25 mmol/L (ref 22–32)

## 2017-05-27 LAB — CBC WITH DIFFERENTIAL/PLATELET
Basophils Absolute: 0 10*3/uL (ref 0.0–0.1)
Basophils Relative: 0 %
EOS PCT: 1 %
Eosinophils Absolute: 0.1 10*3/uL (ref 0.0–0.7)
HCT: 42.2 % (ref 36.0–46.0)
Hemoglobin: 14.3 g/dL (ref 12.0–15.0)
Lymphocytes Relative: 10 %
Lymphs Abs: 1.4 10*3/uL (ref 0.7–4.0)
MCH: 31.6 pg (ref 26.0–34.0)
MCHC: 33.9 g/dL (ref 30.0–36.0)
MCV: 93.2 fL (ref 78.0–100.0)
MONO ABS: 0.7 10*3/uL (ref 0.1–1.0)
MONOS PCT: 5 %
Neutro Abs: 10.9 10*3/uL — ABNORMAL HIGH (ref 1.7–7.7)
Neutrophils Relative %: 84 %
PLATELETS: 326 10*3/uL (ref 150–400)
RBC: 4.53 MIL/uL (ref 3.87–5.11)
RDW: 12.7 % (ref 11.5–15.5)
WBC: 13 10*3/uL — ABNORMAL HIGH (ref 4.0–10.5)

## 2017-05-27 MED ORDER — ONDANSETRON 8 MG PO TBDP: ORAL_TABLET | ORAL | 0 refills | Status: DC

## 2017-05-27 MED ORDER — FENTANYL CITRATE (PF) 100 MCG/2ML IJ SOLN
50.0000 ug | Freq: Once | INTRAMUSCULAR | Status: AC
  Administered 2017-05-27: 50 ug via INTRAVENOUS
  Filled 2017-05-27: qty 2

## 2017-05-27 MED ORDER — DICLOFENAC SODIUM ER 100 MG PO TB24: 100.0000 mg | ORAL_TABLET | Freq: Every day | ORAL | 0 refills | Status: DC

## 2017-05-27 MED ORDER — TAMSULOSIN HCL 0.4 MG PO CAPS
0.4000 mg | ORAL_CAPSULE | ORAL | Status: AC
  Administered 2017-05-27: 0.4 mg via ORAL
  Filled 2017-05-27: qty 1

## 2017-05-27 NOTE — ED Notes (Addendum)
AVS reviewed with pt and husband. All questions answered. Pt denies need for narcotic Rx as she states she has enough at home. Pt also denies need for Flomax, or urine strainer as she has those at home as well.

## 2017-05-27 NOTE — ED Provider Notes (Signed)
[facility]-EMERGENCY DEPT Provider Note   CSN: [id number] Arrival date & time: 05/26/17  1924     History   Chief Complaint Chief Complaint  Patient presents with  . Flank Pain    HPI Stephanie Mcclure is a 47 y.o. female.  The history is provided by the patient.  Flank Pain  This is a recurrent problem. The current episode started yesterday. The problem occurs constantly. The problem has not changed since onset.Pertinent negatives include no chest pain, no abdominal pain, no headaches and no shortness of breath. Nothing aggravates the symptoms. Nothing relieves the symptoms. She has tried rest (hydrocodone ) for the symptoms. The treatment provided no relief.  Has known stones and had pain in February and PMD prescribed Norco.  Symptoms resolved.  Then recurred.  Call PMDs partner this evening and was instructed to increase Norco to 2 tabs.  Pain still intractable now with emesis.  Urine with pressure at end of the stream and n/v.    Past Medical History:  Diagnosis Date  . Chronic kidney disease     Patient Active Problem List   Diagnosis Date Noted  . Ureteral calculus 08/15/2011    Past Surgical History:  Procedure Laterality Date  . CYSTOSCOPY W/ URETEROSCOPY     2004     OB History   None      Home Medications    Prior to Admission medications   Medication Sig Start Date End Date Taking? Authorizing Provider  acetaminophen (TYLENOL) 325 MG tablet Take 650 mg by mouth daily as needed for moderate pain.   Yes [provider]  HYDROcodone-acetaminophen (NORCO/VICODIN) 5-325 MG tablet Take 1 tablet by mouth every 6 (six) hours as needed for moderate pain.   Yes [provider]  ibuprofen (ADVIL,MOTRIN) 200 MG tablet Take 200-400 mg by mouth every 6 (six) hours as needed for moderate pain.   Yes [provider]  levocetirizine (XYZAL) 5 MG tablet Take 5 mg by mouth daily as needed for allergies.   Yes [provider]  naproxen sodium (ALEVE) 220 MG tablet Take 220 mg by mouth daily as needed (pain).   Yes [provider]  Diclofenac Sodium CR (VOLTAREN-XR) 100 MG 24 hr tablet Take 1 tablet (100 mg total) by mouth daily. 05/27/17   Shayma Pfefferle, MD  ondansetron (ZOFRAN ODT) 8 MG disintegrating tablet 8mg  ODT q12 hours prn nausea 05/27/17   Tom Ragsdale, MD    Family History No family history on file.  Social History Social History   Tobacco Use  . Smoking status: Never Smoker  Substance Use Topics  . Alcohol use: Yes  . Drug use: No     Allergies   Patient has no known allergies.   Review of Systems Review of Systems  Constitutional: Negative for diaphoresis and fever.  Respiratory: Negative for shortness of breath.   Cardiovascular: Negative for chest pain.  Gastrointestinal: Positive for nausea and vomiting. Negative for abdominal pain.  Genitourinary: Positive for dysuria and flank pain.  Neurological: Negative for headaches.  All other systems reviewed and are negative.    Physical Exam Updated Vital Signs BP 105/63 (BP Location: Left Arm)   Pulse 66   Temp 97.8 F (36.6 C) (Oral)   Resp 16   Wt 71.9 kg (158 lb 8 oz)   SpO2 100%   BMI 26.38 kg/m   Physical Exam  Constitutional: She is oriented to person, place, and time. She appears well-developed and  well-nourished.  HENT:  Head: Normocephalic and atraumatic.  Mouth/Throat: No oropharyngeal exudate.  Eyes: Pupils are equal, round, and reactive to light. Conjunctivae are normal.  Neck: Normal range of motion. Neck supple.  Cardiovascular: Normal rate, regular rhythm, normal heart sounds and intact distal pulses.  Pulmonary/Chest: Effort normal and breath sounds normal. No stridor. She has no wheezes. She has no rales.  Abdominal: Soft. Bowel sounds are normal. She exhibits no mass. There is no tenderness. There is no rebound and no guarding.  Musculoskeletal: Normal range of motion.    Neurological: She is alert and oriented to person, place, and time. She displays normal reflexes.  Skin: Skin is warm and dry. Capillary refill takes less than 2 seconds.  Psychiatric: She has a normal mood and affect.  Nursing note and vitals reviewed.    ED Treatments / Results  Labs (all labs ordered are listed, but only abnormal results are displayed) Results for orders placed or performed during the hospital encounter of 05/26/17  Urinalysis, Routine w reflex microscopic- may I&O cath if menses  Result Value Ref Range   Color, Urine YELLOW YELLOW   APPearance CLEAR CLEAR   Specific Gravity, Urine 1.018 1.005 - 1.030   pH 6.0 5.0 - 8.0   Glucose, UA NEGATIVE NEGATIVE mg/dL   Hgb urine dipstick LARGE (A) NEGATIVE   Bilirubin Urine NEGATIVE NEGATIVE   Ketones, ur 80 (A) NEGATIVE mg/dL   Protein, ur NEGATIVE NEGATIVE mg/dL   Nitrite NEGATIVE NEGATIVE   Leukocytes, UA NEGATIVE NEGATIVE   RBC / HPF TOO NUMEROUS TO COUNT 0 - 5 RBC/hpf   WBC, UA 0-5 0 - 5 WBC/hpf   Bacteria, UA NONE SEEN NONE SEEN   Squamous Epithelial / LPF 0-5 (A) NONE SEEN   Mucus PRESENT    Ca Oxalate Crys, UA PRESENT   CBC with Differential/Platelet  Result Value Ref Range   WBC 13.0 (H) 4.0 - 10.5 K/uL   RBC 4.53 3.87 - 5.11 MIL/uL   Hemoglobin 14.3 12.0 - 15.0 g/dL   HCT 13.0 86.5 - 78.4 %   MCV 93.2 78.0 - 100.0 fL   MCH 31.6 26.0 - 34.0 pg   MCHC 33.9 30.0 - 36.0 g/dL   RDW 69.6 29.5 - 28.4 %   Platelets 326 150 - 400 K/uL   Neutrophils Relative % 84 %   Neutro Abs 10.9 (H) 1.7 - 7.7 K/uL   Lymphocytes Relative 10 %   Lymphs Abs 1.4 0.7 - 4.0 K/uL   Monocytes Relative 5 %   Monocytes Absolute 0.7 0.1 - 1.0 K/uL   Eosinophils Relative 1 %   Eosinophils Absolute 0.1 0.0 - 0.7 K/uL   Basophils Relative 0 %   Basophils Absolute 0.0 0.0 - 0.1 K/uL  POC urine preg, ED  Result Value Ref Range   Preg Test, Ur NEGATIVE NEGATIVE  I-Stat Chem 8, ED  Result Value Ref Range   Sodium 136 135 - 145  mmol/L   Potassium 3.7 3.5 - 5.1 mmol/L   Chloride 99 (L) 101 - 111 mmol/L   BUN 11 6 - 20 mg/dL   Creatinine, Ser 1.32 (H) 0.44 - 1.00 mg/dL   Glucose, Bld 440 (H) 65 - 99 mg/dL   Calcium, Ion 1.02 7.25 - 1.40 mmol/L   TCO2 25 22 - 32 mmol/L   Hemoglobin 15.0 12.0 - 15.0 g/dL   HCT 36.6 44.0 - 34.7 %   Ct Renal Stone Study  Result Date: 05/27/2017 CLINICAL DATA:  Left flank pain.  Nausea and vomiting. EXAM: CT ABDOMEN AND PELVIS WITHOUT CONTRAST TECHNIQUE: Multidetector CT imaging of the abdomen and pelvis was performed following the standard protocol without IV contrast. COMPARISON:  CT 08/02/2011 FINDINGS: Lower chest: Lung bases are clear. Hepatobiliary: No evidence of focal lesion allowing for lack contrast. Gallbladder partially distended. No pericholecystic inflammation or calcified stone. Pancreas: No ductal dilatation or inflammation. Spleen: Normal in size without focal abnormality. Adrenals/Urinary Tract: No adrenal nodule. Obstructing 5 x 7 mm stone in the left mid ureter with moderate proximal hydroureteronephrosis and perinephric edema. Ureter distal to this is decompressed. A 0.3 cm calcifications in the upper left kidney may be within the parenchyma or calyceal diverticulum. There is a punctate nonobstructing left upper pole stone. Mild right hydronephrosis. No right ureteral stone. No right perinephric edema. Urinary bladder is partially distended. There is a 3 mm stone in the dependent bladder. Stomach/Bowel: Bowel evaluation is limited in the absence of enteric contrast. Stomach distended with enteric contents. No bowel obstruction or inflammation. Normal appendix. Moderate stool burden throughout the colon. Vascular/Lymphatic: Normal course and caliber of abdominal vessels. Prominent central mesenteric nodes are likely reactive. Reproductive: Intrauterine device appropriately positioned in the uterus. Ovaries symmetric in size. Other: Minimal free fluid in the pelvis which may be  physiologic or reactive. No upper abdominal ascites. No free air. Small fat containing umbilical hernia. Musculoskeletal: There are no acute or suspicious osseous abnormalities. IMPRESSION: 1. Obstructing 5 x 7 mm stone in the left mid ureter with moderate hydroureteronephrosis. 2. Minimal right hydronephrosis without ureteral calculi or perinephric edema. There is a stone in the dependent bladder that may reflect recently passed stone. 3. Nonobstructing stones in the left kidney. Electronically Signed   By: Rubye Oaks M.D.   On: 05/27/2017 00:52    Radiology Ct Renal Stone Study  Result Date: 05/27/2017 CLINICAL DATA:  Left flank pain.  Nausea and vomiting. EXAM: CT ABDOMEN AND PELVIS WITHOUT CONTRAST TECHNIQUE: Multidetector CT imaging of the abdomen and pelvis was performed following the standard protocol without IV contrast. COMPARISON:  CT 08/02/2011 FINDINGS: Lower chest: Lung bases are clear. Hepatobiliary: No evidence of focal lesion allowing for lack contrast. Gallbladder partially distended. No pericholecystic inflammation or calcified stone. Pancreas: No ductal dilatation or inflammation. Spleen: Normal in size without focal abnormality. Adrenals/Urinary Tract: No adrenal nodule. Obstructing 5 x 7 mm stone in the left mid ureter with moderate proximal hydroureteronephrosis and perinephric edema. Ureter distal to this is decompressed. A 0.3 cm calcifications in the upper left kidney may be within the parenchyma or calyceal diverticulum. There is a punctate nonobstructing left upper pole stone. Mild right hydronephrosis. No right ureteral stone. No right perinephric edema. Urinary bladder is partially distended. There is a 3 mm stone in the dependent bladder. Stomach/Bowel: Bowel evaluation is limited in the absence of enteric contrast. Stomach distended with enteric contents. No bowel obstruction or inflammation. Normal appendix. Moderate stool burden throughout the colon. Vascular/Lymphatic:  Normal course and caliber of abdominal vessels. Prominent central mesenteric nodes are likely reactive. Reproductive: Intrauterine device appropriately positioned in the uterus. Ovaries symmetric in size. Other: Minimal free fluid in the pelvis which may be physiologic or reactive. No upper abdominal ascites. No free air. Small fat containing umbilical hernia. Musculoskeletal: There are no acute or suspicious osseous abnormalities. IMPRESSION: 1. Obstructing 5 x 7 mm stone in the left mid ureter with moderate hydroureteronephrosis. 2. Minimal right hydronephrosis without ureteral calculi or perinephric edema. There is a stone  in the dependent bladder that may reflect recently passed stone. 3. Nonobstructing stones in the left kidney. Electronically Signed   By: Rubye OaksMelanie  Ehinger M.D.   On: 05/27/2017 00:52    Procedures Procedures (including critical care time)  Medications Ordered in ED Medications  ketorolac (TORADOL) 30 MG/ML injection 15 mg (15 mg Intravenous Given 05/26/17 2346)  ondansetron (ZOFRAN) injection 4 mg (4 mg Intravenous Given 05/26/17 2346)  sodium chloride 0.9 % bolus 500 mL (0 mLs Intravenous Stopped 05/27/17 0104)  fentaNYL (SUBLIMAZE) injection 50 mcg (50 mcg Intravenous Given 05/27/17 0123)  tamsulosin (FLOMAX) capsule 0.4 mg (0.4 mg Oral Given 05/27/17 0123)      Final Clinical Impressions(s) / ED Diagnoses  Patient refused additional norco RX states she has enough, with nurse present.  Also states she has flomax and does not need another strainer and has an appointment with her urologist on the 25th.  Was given RX for voltaren and zofran ODT to add to her home medication.  PO challenged successfully in the ED and was observed for any additional pain or emesis.    Return for weakness, numbness, changes in vision or speech, fevers >100.4 unrelieved by medication, shortness of breath, intractable vomiting, or diarrhea, abdominal pain, Inability to tolerate liquids or food,  cough, altered mental status or any concerns. No signs of systemic illness or infection. The patient is nontoxic-appearing on exam and vital signs are within normal limits.   I have reviewed the triage vital signs and the nursing notes. Pertinent labs &imaging results that were available during my care of the patient were reviewed by me and considered in my medical decision making (see chart for details).  After history, exam, and medical workup I feel the patient has been appropriately medically screened and is safe for discharge home. Pertinent diagnoses were discussed with the patient. Patient was given return precautions. Final diagnoses:  Ureteral colic    ED Discharge Orders        Ordered    Diclofenac Sodium CR (VOLTAREN-XR) 100 MG 24 hr tablet  Daily     05/27/17 0300    ondansetron (ZOFRAN ODT) 8 MG disintegrating tablet     05/27/17 0300       Merlean Pizzini, MD 05/27/17 [id number]

## 2017-05-27 NOTE — ED Notes (Signed)
Patient tolerates po fluids without difficulty.

## 2017-05-29 ENCOUNTER — Other Ambulatory Visit: Payer: Self-pay | Admitting: Urology

## 2017-05-30 ENCOUNTER — Other Ambulatory Visit: Payer: Self-pay

## 2017-05-30 ENCOUNTER — Encounter (HOSPITAL_BASED_OUTPATIENT_CLINIC_OR_DEPARTMENT_OTHER): Payer: Self-pay | Admitting: *Deleted

## 2017-05-30 NOTE — Progress Notes (Addendum)
SPOKE WITH Kathie NPO AFTER MIDNIGHT ARRIVE 730 AM [facility] 05-31-17 MEDS TO TAKE SIP OF WATERHYDROCODONE PRN, ZOFRAN PRN LABS DONE 05-26-17 ON CHART/EPIC I STAT CHEM 8, CBC WITH DIF, PLATLET, URINE PREGNANCY, URINALYSIS DRIVER Maudie MercurySPOUSE JOHN CELL [phone] ORDERS NEED SECOND SIGN

## 2017-05-31 ENCOUNTER — Encounter (HOSPITAL_BASED_OUTPATIENT_CLINIC_OR_DEPARTMENT_OTHER): Payer: Self-pay

## 2017-05-31 ENCOUNTER — Ambulatory Visit (HOSPITAL_BASED_OUTPATIENT_CLINIC_OR_DEPARTMENT_OTHER): Payer: 59 | Admitting: Anesthesiology

## 2017-05-31 ENCOUNTER — Encounter (HOSPITAL_BASED_OUTPATIENT_CLINIC_OR_DEPARTMENT_OTHER)
Admission: RE | Disposition: A | Discharge status: Home / Self Care | Payer: Self-pay | Source: Ambulatory Visit | Attending: Urology

## 2017-05-31 ENCOUNTER — Ambulatory Visit (HOSPITAL_BASED_OUTPATIENT_CLINIC_OR_DEPARTMENT_OTHER)
Admission: RE | Admit: 2017-05-31 | Discharge: 2017-05-31 | Disposition: A | Discharge status: Home / Self Care | Payer: 59 | Source: Ambulatory Visit | Attending: Urology | Admitting: Urology

## 2017-05-31 DIAGNOSIS — N201 Calculus of ureter: Secondary | ICD-10-CM | POA: Diagnosis not present

## 2017-05-31 DIAGNOSIS — Z87442 Personal history of urinary calculi: Secondary | ICD-10-CM | POA: Insufficient documentation

## 2017-05-31 DIAGNOSIS — N2889 Other specified disorders of kidney and ureter: Secondary | ICD-10-CM | POA: Diagnosis not present

## 2017-05-31 DIAGNOSIS — N200 Calculus of kidney: Secondary | ICD-10-CM | POA: Insufficient documentation

## 2017-05-31 DIAGNOSIS — Z79899 Other long term (current) drug therapy: Secondary | ICD-10-CM | POA: Insufficient documentation

## 2017-05-31 HISTORY — DX: Personal history of urinary calculi: Z87.442

## 2017-05-31 HISTORY — PX: HOLMIUM LASER APPLICATION: SHX5852

## 2017-05-31 HISTORY — PX: CYSTOSCOPY WITH RETROGRADE PYELOGRAM, URETEROSCOPY AND STENT PLACEMENT: SHX5789

## 2017-05-31 SURGERY — CYSTOURETEROSCOPY, WITH RETROGRADE PYELOGRAM AND STENT INSERTION
Anesthesia: General | Site: Renal | Laterality: Left

## 2017-05-31 MED ORDER — FENTANYL CITRATE (PF) 100 MCG/2ML IJ SOLN
INTRAMUSCULAR | Status: AC
Start: 2017-05-31 — End: ?
  Filled 2017-05-31: qty 2

## 2017-05-31 MED ORDER — TAMSULOSIN HCL 0.4 MG PO CAPS: 0.4000 mg | ORAL_CAPSULE | Freq: Every day | ORAL | 3 refills | Status: DC

## 2017-05-31 MED ORDER — MIDAZOLAM HCL 2 MG/2ML IJ SOLN
INTRAMUSCULAR | Status: DC | PRN
  Administered 2017-05-31: 1 mg via INTRAVENOUS

## 2017-05-31 MED ORDER — KETOROLAC TROMETHAMINE 30 MG/ML IJ SOLN
INTRAMUSCULAR | Status: DC | PRN
  Administered 2017-05-31: 30 mg via INTRAVENOUS

## 2017-05-31 MED ORDER — OXYBUTYNIN CHLORIDE 5 MG PO TABS: 5.0000 mg | ORAL_TABLET | Freq: Three times a day (TID) | ORAL | 3 refills | Status: DC | PRN

## 2017-05-31 MED ORDER — TAMSULOSIN HCL 0.4 MG PO CAPS
0.4000 mg | ORAL_CAPSULE | Freq: Every day | ORAL | Status: DC
  Filled 2017-05-31: qty 1

## 2017-05-31 MED ORDER — LIDOCAINE HCL (CARDIAC) 20 MG/ML IV SOLN
INTRAVENOUS | Status: DC | PRN
  Administered 2017-05-31: 80 mg via INTRAVENOUS

## 2017-05-31 MED ORDER — FENTANYL CITRATE (PF) 100 MCG/2ML IJ SOLN
INTRAMUSCULAR | Status: DC | PRN
  Administered 2017-05-31: 50 ug via INTRAVENOUS
  Administered 2017-05-31 (×2): 25 ug via INTRAVENOUS

## 2017-05-31 MED ORDER — KETOROLAC TROMETHAMINE 30 MG/ML IJ SOLN
INTRAMUSCULAR | Status: AC
  Filled 2017-05-31: qty 1

## 2017-05-31 MED ORDER — LIDOCAINE 2% (20 MG/ML) 5 ML SYRINGE
INTRAMUSCULAR | Status: AC
  Filled 2017-05-31: qty 5

## 2017-05-31 MED ORDER — PROPOFOL 10 MG/ML IV BOLUS
INTRAVENOUS | Status: AC
  Filled 2017-05-31: qty 20

## 2017-05-31 MED ORDER — PROPOFOL 10 MG/ML IV BOLUS
INTRAVENOUS | Status: DC | PRN
  Administered 2017-05-31: 150 mg via INTRAVENOUS
  Administered 2017-05-31: 50 mg via INTRAVENOUS

## 2017-05-31 MED ORDER — HYDROMORPHONE HCL 1 MG/ML IJ SOLN
0.2500 mg | INTRAMUSCULAR | Status: DC | PRN
  Filled 2017-05-31: qty 0.5

## 2017-05-31 MED ORDER — ONDANSETRON HCL 4 MG/2ML IJ SOLN
INTRAMUSCULAR | Status: AC
  Filled 2017-05-31: qty 2

## 2017-05-31 MED ORDER — MIDAZOLAM HCL 2 MG/2ML IJ SOLN
INTRAMUSCULAR | Status: AC
  Filled 2017-05-31: qty 2

## 2017-05-31 MED ORDER — CEFAZOLIN SODIUM-DEXTROSE 2-4 GM/100ML-% IV SOLN
INTRAVENOUS | Status: AC
  Filled 2017-05-31: qty 100

## 2017-05-31 MED ORDER — CEFAZOLIN SODIUM-DEXTROSE 2-3 GM-%(50ML) IV SOLR
INTRAVENOUS | Status: DC | PRN
  Administered 2017-05-31: 2 g via INTRAVENOUS

## 2017-05-31 MED ORDER — PROMETHAZINE HCL 25 MG/ML IJ SOLN
6.2500 mg | INTRAMUSCULAR | Status: DC | PRN
  Filled 2017-05-31: qty 1

## 2017-05-31 MED ORDER — DEXAMETHASONE SODIUM PHOSPHATE 10 MG/ML IJ SOLN
INTRAMUSCULAR | Status: AC
  Filled 2017-05-31: qty 1

## 2017-05-31 MED ORDER — OXYBUTYNIN CHLORIDE 5 MG PO TABS
ORAL_TABLET | ORAL | Status: AC
  Filled 2017-05-31: qty 1

## 2017-05-31 MED ORDER — SODIUM CHLORIDE 0.9 % IR SOLN
Status: DC | PRN
  Administered 2017-05-31: 4000 mL

## 2017-05-31 MED ORDER — OXYBUTYNIN CHLORIDE 5 MG PO TABS
5.0000 mg | ORAL_TABLET | Freq: Three times a day (TID) | ORAL | Status: DC | PRN
  Administered 2017-05-31: 5 mg via ORAL
  Filled 2017-05-31: qty 1

## 2017-05-31 MED ORDER — ONDANSETRON HCL 4 MG/2ML IJ SOLN
INTRAMUSCULAR | Status: DC | PRN
  Administered 2017-05-31: 4 mg via INTRAVENOUS

## 2017-05-31 MED ORDER — DEXAMETHASONE SODIUM PHOSPHATE 4 MG/ML IJ SOLN
INTRAMUSCULAR | Status: DC | PRN
  Administered 2017-05-31: 10 mg via INTRAVENOUS

## 2017-05-31 MED ORDER — OXYCODONE HCL 5 MG PO TABS
5.0000 mg | ORAL_TABLET | Freq: Once | ORAL | Status: DC | PRN
  Filled 2017-05-31: qty 1

## 2017-05-31 MED ORDER — IOHEXOL 300 MG/ML  SOLN
INTRAMUSCULAR | Status: DC | PRN
  Administered 2017-05-31: 10 mL

## 2017-05-31 MED ORDER — LACTATED RINGERS IV SOLN
INTRAVENOUS | Status: DC
  Administered 2017-05-31 (×2): via INTRAVENOUS
  Filled 2017-05-31: qty 1000

## 2017-05-31 MED ORDER — OXYCODONE HCL 5 MG/5ML PO SOLN
5.0000 mg | Freq: Once | ORAL | Status: DC | PRN
  Filled 2017-05-31: qty 5

## 2017-05-31 SURGICAL SUPPLY — 24 items
BAG DRAIN URO-CYSTO SKYTR STRL (DRAIN) ×3 IMPLANT
BAG DRN UROCATH (DRAIN) ×1
CATH INTERMIT  6FR 70CM (CATHETERS) ×3 IMPLANT
CLOTH BEACON ORANGE TIMEOUT ST (SAFETY) ×3 IMPLANT
FIBER LASER FLEXIVA 365 (UROLOGICAL SUPPLIES) IMPLANT
FIBER LASER TRAC TIP (UROLOGICAL SUPPLIES) ×3 IMPLANT
GLOVE BIO SURGEON STRL SZ7.5 (GLOVE) ×3 IMPLANT
GLOVE BIOGEL PI IND STRL 8.5 (GLOVE) ×2 IMPLANT
GLOVE BIOGEL PI INDICATOR 8.5 (GLOVE) ×4
GLOVE INDICATOR 8.5 STRL (GLOVE) ×3 IMPLANT
GOWN STRL REUS W/TWL XL LVL3 (GOWN DISPOSABLE) ×6 IMPLANT
GUIDEWIRE STR DUAL SENSOR (WIRE) ×6 IMPLANT
INFUSOR MANOMETER BAG 3000ML (MISCELLANEOUS) ×3 IMPLANT
IV NS 1000ML (IV SOLUTION) ×3
IV NS 1000ML BAXH (IV SOLUTION) ×1 IMPLANT
IV NS IRRIG 3000ML ARTHROMATIC (IV SOLUTION) ×3 IMPLANT
KIT TURNOVER CYSTO (KITS) ×3 IMPLANT
MANIFOLD NEPTUNE II (INSTRUMENTS) ×3 IMPLANT
NS IRRIG 500ML POUR BTL (IV SOLUTION) IMPLANT
PACK CYSTO (CUSTOM PROCEDURE TRAY) ×3 IMPLANT
SHEATH URET ACCESS 12FR/35CM (UROLOGICAL SUPPLIES) ×3 IMPLANT
STENT URET 6FRX24 CONTOUR (STENTS) ×3 IMPLANT
TUBE CONNECTING 12'X1/4 (SUCTIONS)
TUBE CONNECTING 12X1/4 (SUCTIONS) IMPLANT

## 2017-05-31 NOTE — Discharge Instructions (Addendum)
Post Anesthesia Home Care Instructions  Activity: Get plenty of rest for the remainder of the day. A responsible individual must stay with you for 24 hours following the procedure.  For the next 24 hours, DO NOT: -Drive a car -Operate machinery -Drink alcoholic beverages -Take any medication unless instructed by your physician -Make any legal decisions or sign important papers.  Meals: Start with liquid foods such as gelatin or soup. Progress to regular foods as tolerated. Avoid greasy, spicy, heavy foods. If nausea and/or vomiting occur, drink only clear liquids until the nausea and/or vomiting subsides. Call your physician if vomiting continues.  Special Instructions/Symptoms: Your throat may feel dry or sore from the anesthesia or the breathing tube placed in your throat during surgery. If this causes discomfort, gargle with warm salt water. The discomfort should disappear within 24 hours.  If you had a scopolamine patch placed behind your ear for the management of post- operative nausea and/or vomiting:  1. The medication in the patch is effective for 72 hours, after which it should be removed.  Wrap patch in a tissue and discard in the trash. Wash hands thoroughly with soap and water. 2. You may remove the patch earlier than 72 hours if you experience unpleasant side effects which may include dry mouth, dizziness or visual disturbances. 3. Avoid touching the patch. Wash your hands with soap and water after contact with the patch.      [facility] [phone] Post Ureteroscopy With or Without Stent Instructions  Definitions:  Ureter: The duct that transports urine from the kidney to the bladder. Stent:   A plastic hollow tube that is placed into the ureter, from the kidney to the bladder to prevent the ureter from swelling shut.  GENERAL INSTRUCTIONS:  Despite the fact that no skin incisions were used, the area around the ureter and bladder is raw and  irritated. The stent is a foreign body which will further irritate the bladder wall. This irritation is manifested by increased frequency of urination, both day and night, and by an increase in the urge to urinate. In some, the urge to urinate is present almost always. Sometimes the urge is strong enough that you may not be able to stop yourself from urinating. The only real cure is to remove the stent and then give time for the bladder wall to heal which can't be done until the danger of the ureter swelling shut has passed, which varies.  You may see some blood in your urine while the stent is in place and a few days afterwards. Do not be alarmed, even if the urine was clear for a while. Get off your feet and drink lots of fluids until clearing occurs. If you start to pass clots or don't improve, call us.  DIET: You may return to your normal diet immediately. Because of the raw surface of your bladder, alcohol, spicy foods, acid type foods and drinks with caffeine may cause irritation or frequency and should be used in moderation. To keep your urine flowing freely and to avoid constipation, drink plenty of fluids during the day ( 8-10 glasses ). Tip: Avoid cranberry juice because it is very acidic.  ACTIVITY: Your physical activity doesn't need to be restricted. However, if you are very active, you may see some blood in your urine. We suggest that you reduce your activity under these circumstances until the bleeding has stopped.  BOWELS: It is important to keep your bowels regular during the postoperative period. Straining   BOWELS: °It is important to keep your bowels regular during the postoperative period. Straining with bowel movements can cause bleeding. A bowel movement every other day is reasonable. Use a mild laxative if needed, such as Milk of Magnesia 2-3 tablespoons, or 2 Dulcolax tablets. Call if you continue to have problems. If you have been taking narcotics for pain, before, during or after your surgery, you may be constipated. Take a laxative if necessary. ° ° °MEDICATION: °You  should resume your pre-surgery medications unless told not to. °You may take oxybutynin or flomax if prescribed for bladder spasms or discomfort from the stent °Take pain medication as directed for pain refractory to conservative management ° °PROBLEMS YOU SHOULD REPORT TO US: °· Fevers over 100.5 Fahrenheit. °· Heavy bleeding, or clots ( See above notes about blood in urine ). °· Inability to urinate. °· Drug reactions ( hives, rash, nausea, vomiting, diarrhea ). °· Severe burning or pain with urination that is not improving. ° °

## 2017-05-31 NOTE — Op Note (Signed)
Operative Note  Preoperative diagnosis:  1.  Left renal and ureteral calculus  Postoperative diagnosis: 1.  Left renal and ureteral calculus 2.  Left calyceal diverticulum  Procedure(s): 1.  Cystoscopy with left ureteroscopy with laser lithotripsy and ureteral stent placement with retrograde pyelogram with interpretation 2.  Laser ablation of renal calyceal diverticulum  Surgeon: Modena SlaterEugene Matisse Roskelley, MD  Assistants: None  Anesthesia: General  Complications: None immediate  EBL: Minimal  Specimens: 1.  None  Drains/Catheters: 1.  6 x 24 double-J ureteral stent on a string  Intraoperative findings: 1.  Normal urethra and bladder 2.  Left 8 mm mid ureteral calculus easily fragmented to tiny fragments 3.  Left retrograde pyelogram revealed a well opacified kidney with no obvious filling defects and confirmed stones likely within a diverticulum. 4.  Pyeloscopy confirmed a calyceal diverticulum which was opened and the stone within it was fragmented to 1 mm fragments or less.  Indication: 47 year old female with acute left-sided flank pain was found to have a left ureteral calculus.  She failed trial of passage.  She presents for the previously mentioned operation.  Description of procedure:  The patient was identified and consent was obtained.  The patient was taken to the operating room and placed in the supine position.  The patient was placed under general anesthesia.  Perioperative antibiotics were administered.  The patient was placed in dorsal lithotomy.  Patient was prepped and draped in a standard sterile fashion and a timeout was performed.  A 21 French rigid cystoscope was advanced into the urethra and into the bladder.  A wire was advanced up the left kidney under fluoroscopic guidance.  Semirigid ureteroscopy was performed up to the level of the stone and the stone was fragmented to tiny little fragments.  A complete ureteroscopy up to the renal pelvis revealed no other ureteral  calculi.  There was some ureteral edema at the level of the stone impaction but this was not severe.  I performed a retrograde pyelogram through the scope with the findings noted above.  I passed a second wire through the scope and into the kidney.  I then advanced a 12 x 14 ureteral access sheath over 1 of the wires under continuous fluoroscopic guidance and it passed easily up to the renal pelvis.  The inner sheath along with the wire were withdrawn.  Complete pyeloscopy was performed which identified a small opening that led to a calyceal diverticulum.  Laser fiber was used to perform a laser ablation of the calyceal diverticulum.  Within the diverticulum, a large stone fragment was encountered which was fragmented to 1 mm or less fragments.  Whereas the ureteral calculus was a soft stone, this was a very hard stone.  Once this was fragmented, I performed a complete pyeloscopy and no other stone fragments were seen.  I withdrew the scope along with the access sheath and no significant trauma was identified along the course of the ureter and there were no significant stone fragments as well.  I then advanced a 6 x 24 double-J ureteral stent over the wire and placed up fluoroscopically followed by removal of the wire.  Fluoroscopy confirmed proximal as well as distal placement.  The bladder was drained and this concluded the operation.  The patient tolerated the procedure well and was stable postoperatively.  Plan: The patient may remove her stent on Friday morning.  Follow-up in 4-6 weeks with a renal ultrasound and KUB

## 2017-05-31 NOTE — Anesthesia Postprocedure Evaluation (Signed)
Anesthesia Post Note  Patient: Stephanie Mcclure  Procedure(s) Performed: CYSTOSCOPY WITH LEFT RETROGRADE PYELOGRAM, URETEROSCOPY AND STENT PLACEMENT (Left Renal) HOLMIUM LASER APPLICATION (Left Renal)     Patient location during evaluation: PACU Anesthesia Type: General Level of consciousness: awake and alert Pain management: pain level controlled Vital Signs Assessment: post-procedure vital signs reviewed and stable Respiratory status: spontaneous breathing, nonlabored ventilation, respiratory function stable and patient connected to nasal cannula oxygen Cardiovascular status: blood pressure returned to baseline and stable Postop Assessment: no apparent nausea or vomiting Anesthetic complications: no    Last Vitals:  Vitals:   05/31/17 1115 05/31/17 1158  BP:  106/84  Pulse: 78 84  Resp: 15 16  Temp:  37.2 C  SpO2: 100% 100%    Last Pain:  Vitals:   05/31/17 1158  TempSrc: Oral  PainSc: 0-No pain                 Ryan P Ellender

## 2017-05-31 NOTE — Interval H&P Note (Signed)
History and Physical Interval Note:  05/31/2017 9:31 AM  Stephanie Mcclure  has presented today for surgery, with the diagnosis of LEFT URETERAL CALCULUS  The various methods of treatment have been discussed with the patient and family. After consideration of risks, benefits and other options for treatment, the patient has consented to  Procedure(s): CYSTOSCOPY WITH LEFT RETROGRADE PYELOGRAM, URETEROSCOPY AND STENT PLACEMENT (Left) HOLMIUM LASER APPLICATION (Left) as a surgical intervention .  The patient's history has been reviewed, patient examined, no change in status, stable for surgery.  I have reviewed the patient's chart and labs.  Questions were answered to the patient's satisfaction.     Ray ChurchEugene D Bell, III

## 2017-05-31 NOTE — Anesthesia Procedure Notes (Signed)
Procedure Name: LMA Insertion Date/Time: 05/31/2017 9:44 AM Performed by: Earmon PhoenixWilkerson, Lorel Lembo P, CRNA Pre-anesthesia Checklist: Patient identified, Emergency Drugs available, Suction available, Patient being monitored and Timeout performed Patient Re-evaluated:Patient Re-evaluated prior to induction Oxygen Delivery Method: Circle system utilized Preoxygenation: Pre-oxygenation with 100% oxygen Induction Type: IV induction Ventilation: Mask ventilation without difficulty LMA: LMA inserted LMA Size: 4.0 Number of attempts: 1 Placement Confirmation: positive ETCO2,  CO2 detector and breath sounds checked- equal and bilateral Tube secured with: Tape Dental Injury: Teeth and Oropharynx as per pre-operative assessment

## 2017-05-31 NOTE — Transfer of Care (Signed)
Immediate Anesthesia Transfer of Care Note  Patient: Stephanie Mcclure  Procedure(s) Performed: CYSTOSCOPY WITH LEFT RETROGRADE PYELOGRAM, URETEROSCOPY AND STENT PLACEMENT (Left Renal) HOLMIUM LASER APPLICATION (Left Renal)  Patient Location: PACU  Anesthesia Type:General  Level of Consciousness: awake and patient cooperative  Airway & Oxygen Therapy: Patient Spontanous Breathing and Patient connected to nasal cannula oxygen  Post-op Assessment: Report given to RN and Post -op Vital signs reviewed and stable  Post vital signs: Reviewed and stable  Last Vitals:  Vitals Value Taken Time  BP    Temp    Pulse    Resp    SpO2      Last Pain:  Vitals:   05/31/17 0750  TempSrc: Oral      Patients Stated Pain Goal: 6 (05/31/17 0804)  Complications: No apparent anesthesia complications

## 2017-05-31 NOTE — Anesthesia Preprocedure Evaluation (Addendum)
Anesthesia Evaluation  Patient identified by MRN, date of birth, ID band Patient awake    Reviewed: Allergy & Precautions, NPO status , Patient's Chart, lab work & pertinent test results  Airway Mallampati: I  TM Distance: >3 FB Neck ROM: Full    Dental no notable dental hx.    Pulmonary neg pulmonary ROS,    Pulmonary exam normal breath sounds clear to auscultation       Cardiovascular negative cardio ROS Normal cardiovascular exam Rhythm:Regular Rate:Normal     Neuro/Psych negative neurological ROS  negative psych ROS   GI/Hepatic negative GI ROS, Neg liver ROS,   Endo/Other  negative endocrine ROS  Renal/GU negative Renal ROS     Musculoskeletal negative musculoskeletal ROS (+)   Abdominal   Peds  Hematology negative hematology ROS (+)   Anesthesia Other Findings LEFT URETERAL CALCULUS  Reproductive/Obstetrics hcg negative                            Anesthesia Physical Anesthesia Plan  ASA: II  Anesthesia Plan: General   Post-op Pain Management:    Induction: Intravenous  PONV Risk Score and Plan: 3 and Ondansetron, Dexamethasone, Midazolam and Treatment may vary due to age or medical condition  Airway Management Planned: LMA  Additional Equipment:   Intra-op Plan:   Post-operative Plan: Extubation in OR  Informed Consent: I have reviewed the patients History and Physical, chart, labs and discussed the procedure including the risks, benefits and alternatives for the proposed anesthesia with the patient or authorized representative who has indicated his/her understanding and acceptance.   Dental advisory given  Plan Discussed with: CRNA  Anesthesia Plan Comments:         Anesthesia Quick Evaluation

## 2017-05-31 NOTE — H&P (Signed)
H&P  Chief Complaint: Left ureteral calculus  History of Present Illness: 47 year old female with a left ureteral calculus and pain presents for left ureteroscopy with laser lithotripsy and ureteral stent placement.  Past Medical History:  Diagnosis Date  . History of kidney stones 2004, 2011   Past Surgical History:  Procedure Laterality Date  . CYSTOSCOPY W/ URETEROSCOPY     2004  . EXTRACORPOREAL SHOCK WAVE LITHOTRIPSY  2011    Home Medications:  Medications Prior to Admission  Medication Sig Dispense Refill Last Dose  . HYDROcodone-acetaminophen (NORCO/VICODIN) 5-325 MG tablet Take 1 tablet by mouth every 6 (six) hours as needed for moderate pain.   05/30/2017 at Unknown time  . HYDROcodone-acetaminophen (NORCO/VICODIN) 5-325 MG tablet Take 1 tablet by mouth every 6 (six) hours as needed for moderate pain.   05/30/2017 at Unknown time  . levocetirizine (XYZAL) 5 MG tablet Take 5 mg by mouth daily as needed for allergies.   Past Week at Unknown time  . levonorgestrel (MIRENA, 52 MG,) 20 MCG/24HR IUD 1 each by Intrauterine route once. INSERTED May 09 2017     . ondansetron (ZOFRAN ODT) 8 MG disintegrating tablet 8mg  ODT q12 hours prn nausea 12 tablet 0 05/30/2017 at Unknown time  . Diclofenac Sodium CR (VOLTAREN-XR) 100 MG 24 hr tablet Take 1 tablet (100 mg total) by mouth daily. 10 tablet 0 05/27/2017 at 800  . ibuprofen (ADVIL,MOTRIN) 200 MG tablet Take 200-400 mg by mouth every 6 (six) hours as needed for moderate pain.   05/30/2017 at 800  . naproxen sodium (ALEVE) 220 MG tablet Take 220 mg by mouth daily as needed (pain).   05/27/2017 at 800   Allergies: No Known Allergies  History reviewed. No pertinent family history. Social History:  reports that she has never smoked. She has never used smokeless tobacco. She reports that she drinks alcohol. She reports that she does not use drugs.  ROS: A complete review of systems was performed.  All systems are negative except for pertinent  findings as noted. ROS   Physical Exam:  Vital signs in last 24 hours: Temp:  [98 F (36.7 C)] 98 F (36.7 C) (03/27 0750) Pulse Rate:  [85] 85 (03/27 0750) Resp:  [17] 17 (03/27 0750) BP: (107)/(64) 107/64 (03/27 0750) SpO2:  [98 %] 98 % (03/27 0750) Weight:  [69.4 kg (153 lb)-69.7 kg (153 lb 11.2 oz)] 69.7 kg (153 lb 11.2 oz) (03/27 0750) General:  Alert and oriented, No acute distress HEENT: Normocephalic, atraumatic Neck: No JVD or lymphadenopathy Cardiovascular: Regular rate and rhythm Lungs: Regular rate and effort Abdomen: Soft, nontender, nondistended, no abdominal masses Back: No CVA tenderness Extremities: No edema Neurologic: Grossly intact  Laboratory Data:  No results found for this or any previous visit (from the past 24 hour(s)). No results found for this or any previous visit (from the past 240 hour(s)). Creatinine: Recent Labs    05/26/17 2358  CREATININE 1.20*    Impression/Assessment:  Left ureteral calculus  Plan:  Proceed with cystoscopy, left retrograde pyelogram, left ureteroscopy with laser lithotripsy, ureteral stent placement  Ray ChurchEugene D Jill Ruppe, III 05/31/2017, 9:29 AM

## 2017-06-01 ENCOUNTER — Encounter (HOSPITAL_BASED_OUTPATIENT_CLINIC_OR_DEPARTMENT_OTHER): Payer: Self-pay | Admitting: Urology

## 2020-07-06 DIAGNOSIS — Z13220 Encounter for screening for lipoid disorders: Secondary | ICD-10-CM | POA: Diagnosis not present

## 2020-07-06 DIAGNOSIS — Z13228 Encounter for screening for other metabolic disorders: Secondary | ICD-10-CM | POA: Diagnosis not present

## 2020-07-06 DIAGNOSIS — Z01419 Encounter for gynecological examination (general) (routine) without abnormal findings: Secondary | ICD-10-CM | POA: Diagnosis not present

## 2020-07-06 DIAGNOSIS — Z1231 Encounter for screening mammogram for malignant neoplasm of breast: Secondary | ICD-10-CM | POA: Diagnosis not present

## 2020-07-06 DIAGNOSIS — Z6826 Body mass index (BMI) 26.0-26.9, adult: Secondary | ICD-10-CM | POA: Diagnosis not present

## 2021-06-02 ENCOUNTER — Encounter: Payer: Self-pay | Admitting: Radiology

## 2021-06-02 ENCOUNTER — Other Ambulatory Visit: Payer: Self-pay

## 2021-06-02 ENCOUNTER — Ambulatory Visit (INDEPENDENT_AMBULATORY_CARE_PROVIDER_SITE_OTHER): Payer: BLUE CROSS/BLUE SHIELD | Admitting: Radiology

## 2021-06-02 VITALS — BP 116/74 | Ht 64.5 in | Wt 155.0 lb

## 2021-06-02 DIAGNOSIS — Z01419 Encounter for gynecological examination (general) (routine) without abnormal findings: Secondary | ICD-10-CM | POA: Diagnosis not present

## 2021-06-02 DIAGNOSIS — R6882 Decreased libido: Secondary | ICD-10-CM

## 2021-06-02 DIAGNOSIS — N912 Amenorrhea, unspecified: Secondary | ICD-10-CM | POA: Diagnosis not present

## 2021-06-02 DIAGNOSIS — R61 Generalized hyperhidrosis: Secondary | ICD-10-CM | POA: Diagnosis not present

## 2021-06-02 DIAGNOSIS — R5383 Other fatigue: Secondary | ICD-10-CM

## 2021-06-02 NOTE — Progress Notes (Signed)
? ?Stephanie Mcclure Sep 27, 1970 [id number] ? ? ?History:  51 y.o. G2P2 presents for annual exam as a new patient. Transfer from Dr Holland.c/o perimenopausal concerns. Low libido, amenorrhea, weight gain. ? ?Gynecologic History ?No LMP recorded. (Menstrual status: IUD). ?Period Cycle (Days):  (amenorrhea with iud) ?Contraception/Family planning: IUD ?Sexually active: yes, low libido ?Last Pap: 2022. Results were: normal ?Last mammogram: 2022. Results were: normal ? ?Obstetric History ?OB History  ?Gravida Para Term Preterm AB Living  ?2 2       2   ?SAB IAB Ectopic Multiple Live Births  ?        2  ?  ?# Outcome Date GA Lbr Len/2nd Weight Sex Delivery Anes PTL Lv  ?2 Para           ?1 Para           ? ? ? ?The following portions of the patient's history were reviewed and updated as appropriate: allergies, current medications, past family history, past medical history, past social history, past surgical history, and problem list. ? ?Review of Systems ?A comprehensive review of systems was negative except for: Constitutional: positive for fatigue and night sweats ?Genitourinary: positive for sexual problems ?Endocrine: positive for fatigue, inability to lose weight, increased belly fat  ? ?Past medical history, past surgical history, family history and social history were all reviewed and documented in the EPIC chart. ? ? ?Exam: ? ?Vitals:  ? 06/02/21 0853  ?BP: 116/74  ?Weight: 155 lb (70.3 kg)  ?Height: 5' 4.5" (1.638 m)  ? ?Body mass index is 26.19 kg/m?. ? ?General appearance:  Normal ?Thyroid:  Symmetrical, normal in size, without palpable masses or nodularity. ?Respiratory ? Auscultation:  Clear without wheezing or rhonchi ?Cardiovascular ? Auscultation:  Regular rate, without rubs, murmurs or gallops ? Edema/varicosities:  Not grossly evident ?Abdominal ? Soft,nontender, without masses, guarding or rebound. ? Liver/spleen:  No organomegaly noted ? Hernia:  None appreciated ? Skin ? Inspection:  Grossly  normal ?Breasts: Examined lying and sitting.  ? Right: Without masses, retractions, nipple discharge or axillary adenopathy. ? ? Left: Without masses, retractions, nipple discharge or axillary adenopathy. ?Genitourinary  ? Inguinal/mons:  Normal without inguinal adenopathy ? External genitalia:  Normal appearing vulva with no masses, tenderness, or lesions ? BUS/Urethra/Skene's glands:  Normal without masses or exudate ? Vagina:  Normal appearing with normal color and discharge, no lesions ? Cervix:  Normal appearing without discharge or lesions. IUD strings seen. ? Uterus:  Normal in size, shape and contour.  Mobile, nontender ? Adnexa/parametria:   ?  Rt: Normal in size, without masses or tenderness. ?  Lt: Normal in size, without masses or tenderness. ? Anus and perineum: Normal ?  ?Patient informed chaperone available to be present for breast and pelvic exam. Patient has requested no chaperone to be present. Patient has been advised what will be completed during breast and pelvic exam.  ? ?Assessment/Plan:   ?1. Well woman exam with routine gynecological exam ?Pap due 2025 ?Schedule mamm[facility] at [facility] ?Family hx of osteoporosis, will screen once post menopausal ?- HgB A1c ?- Lipid Profile ?- Vitamin D (25 hydroxy) ?- Thyroid Panel With TSH ?- Comp Met (CMET) ? ?2. Amenorrhea ? ?- FSH ?- Estradiol ? ?3. Other fatigue ? ?- B12 and Folate Panel ? ?4. Low libido ? ?- Testosterone ?- Testosterone, free ?- Testosterone, % free ?- Sex hormone binding globulin ?  ? ? ?Will request records from [facility]. Discussed SBE, colonoscopy and DEXA screening as  directed/appropriate. Recommend 168mns of exercise weekly, including weight bearing exercise. Encouraged the use of seatbelts and sunscreen. ?Return in 1 year for annual or as needed.  ? ?CKerry DoryWHNP-BC 9:45 AM 06/02/2021  ?

## 2021-06-03 ENCOUNTER — Other Ambulatory Visit: Payer: Self-pay | Admitting: Radiology

## 2021-06-03 LAB — COMPREHENSIVE METABOLIC PANEL
AG Ratio: 1.8 (calc) (ref 1.0–2.5)
ALT: 15 U/L (ref 6–29)
AST: 17 U/L (ref 10–35)
Albumin: 4.6 g/dL (ref 3.6–5.1)
Alkaline phosphatase (APISO): 53 U/L (ref 37–153)
BUN: 21 mg/dL (ref 7–25)
CO2: 27 mmol/L (ref 20–32)
Calcium: 9.3 mg/dL (ref 8.6–10.4)
Chloride: 103 mmol/L (ref 98–110)
Creat: 0.86 mg/dL (ref 0.50–1.03)
Globulin: 2.5 g/dL (calc) (ref 1.9–3.7)
Glucose, Bld: 91 mg/dL (ref 65–99)
Potassium: 4.4 mmol/L (ref 3.5–5.3)
Sodium: 137 mmol/L (ref 135–146)
Total Bilirubin: 0.5 mg/dL (ref 0.2–1.2)
Total Protein: 7.1 g/dL (ref 6.1–8.1)

## 2021-06-03 LAB — THYROID PANEL WITH TSH
Free Thyroxine Index: 2 (ref 1.4–3.8)
T3 Uptake: 29 % (ref 22–35)
T4, Total: 7 ug/dL (ref 5.1–11.9)
TSH: 1.06 mIU/L

## 2021-06-03 LAB — HEMOGLOBIN A1C
Hgb A1c MFr Bld: 5.4 % of total Hgb (ref <5.7)
Mean Plasma Glucose: 108 mg/dL
eAG (mmol/L): 6 mmol/L

## 2021-06-03 LAB — FOLLICLE STIMULATING HORMONE: FSH: 77.1 m[IU]/mL

## 2021-06-03 LAB — ESTRADIOL: Estradiol: 28 pg/mL

## 2021-06-03 LAB — LIPID PANEL
Cholesterol: 266 mg/dL — ABNORMAL HIGH (ref <200)
HDL: 76 mg/dL (ref >=50)
LDL Cholesterol (Calc): 171 mg/dL (calc) — ABNORMAL HIGH
Non-HDL Cholesterol (Calc): 190 mg/dL (calc) — ABNORMAL HIGH (ref <130)
Total CHOL/HDL Ratio: 3.5 (calc) (ref <5.0)
Triglycerides: 83 mg/dL (ref <150)

## 2021-06-03 LAB — VITAMIN D 25 HYDROXY (VIT D DEFICIENCY, FRACTURES): Vit D, 25-Hydroxy: 19 ng/mL — ABNORMAL LOW (ref 30–100)

## 2021-06-03 LAB — B12 AND FOLATE PANEL
Folate: 12.4 ng/mL
Vitamin B-12: 237 pg/mL (ref 200–1100)

## 2021-06-03 MED ORDER — ESTRADIOL 0.05 MG/24HR TD PTTW: 1.0000 | MEDICATED_PATCH | TRANSDERMAL | 4 refills | Status: DC

## 2021-06-06 LAB — TESTOS,TOTAL,FREE AND SHBG (FEMALE)
Free Testosterone: 1.4 pg/mL (ref 0.1–6.4)
Sex Hormone Binding: 123 nmol/L (ref 17–124)
Testosterone, Total, LC-MS-MS: 33 ng/dL (ref 2–45)

## 2021-07-02 ENCOUNTER — Other Ambulatory Visit: Payer: Self-pay | Admitting: Radiology

## 2021-07-02 DIAGNOSIS — Z1231 Encounter for screening mammogram for malignant neoplasm of breast: Secondary | ICD-10-CM

## 2021-08-05 ENCOUNTER — Telehealth: Payer: Self-pay

## 2021-08-05 ENCOUNTER — Other Ambulatory Visit: Payer: Self-pay | Admitting: Radiology

## 2021-08-05 DIAGNOSIS — Z789 Other specified health status: Secondary | ICD-10-CM

## 2021-08-05 MED ORDER — ESTRADIOL 0.05 MG/24HR TD PTTW: 1.0000 | MEDICATED_PATCH | TRANSDERMAL | 4 refills | Status: DC

## 2021-08-05 NOTE — Telephone Encounter (Signed)
Just FYI - I am holding a copy of the patients form on my desk in a red plastic jacket. When result is back you can just add it to this form. (I am off next week.)  I asked Claudia to scan it in but scan center can take awhile until we can access it.

## 2021-08-05 NOTE — Telephone Encounter (Signed)
Patient would like you to place the Blood Cotine order. She is going to talk with her husband and then call back to schedule lab appointment here. I will have the form scanned into her chart.  Patient was informed Rx sent to [facility].

## 2021-08-05 NOTE — Telephone Encounter (Signed)
Patient called with 2 issues.  #1 She would like her estradiol 0.05 mg patch re-sent to [facility] as she can get if for less cost there.  #2 She said that a form was completed and sent to her life insurance company. It was due by tomorrow and it had been faxed to ins co by our office in plenty of time for due date tomorrow. However, they just contacted her and said that they noticed that there is nothing written beside "Nicotine". It was left blank.  I do not see this form scanned into chart.  Patient is asking if you can help her with this. She said she is non-smoker. Just wants to know the quickest and easiest way to get this taken care of to show negative for nicotine.

## 2021-08-05 NOTE — Telephone Encounter (Signed)
Ordered placed

## 2021-08-31 ENCOUNTER — Ambulatory Visit
Admission: RE | Admit: 2021-08-31 | Discharge: 2021-08-31 | Disposition: A | Discharge status: Home / Self Care | Payer: BLUE CROSS/BLUE SHIELD | Source: Ambulatory Visit | Attending: Radiology | Admitting: Radiology

## 2021-08-31 DIAGNOSIS — Z1231 Encounter for screening mammogram for malignant neoplasm of breast: Secondary | ICD-10-CM

## 2022-05-04 ENCOUNTER — Other Ambulatory Visit: Payer: Self-pay

## 2022-05-04 ENCOUNTER — Encounter (HOSPITAL_BASED_OUTPATIENT_CLINIC_OR_DEPARTMENT_OTHER): Payer: Self-pay

## 2022-05-04 ENCOUNTER — Emergency Department (HOSPITAL_BASED_OUTPATIENT_CLINIC_OR_DEPARTMENT_OTHER)
Admission: EM | Admit: 2022-05-04 | Discharge: 2022-05-05 | Disposition: A | Discharge status: Home / Self Care | Payer: BLUE CROSS/BLUE SHIELD | Attending: Emergency Medicine | Admitting: Emergency Medicine

## 2022-05-04 ENCOUNTER — Emergency Department (HOSPITAL_BASED_OUTPATIENT_CLINIC_OR_DEPARTMENT_OTHER): Payer: BLUE CROSS/BLUE SHIELD

## 2022-05-04 DIAGNOSIS — E871 Hypo-osmolality and hyponatremia: Secondary | ICD-10-CM | POA: Diagnosis not present

## 2022-05-04 DIAGNOSIS — N201 Calculus of ureter: Secondary | ICD-10-CM

## 2022-05-04 DIAGNOSIS — N1330 Unspecified hydronephrosis: Secondary | ICD-10-CM | POA: Diagnosis not present

## 2022-05-04 DIAGNOSIS — R109 Unspecified abdominal pain: Secondary | ICD-10-CM | POA: Diagnosis not present

## 2022-05-04 DIAGNOSIS — N202 Calculus of kidney with calculus of ureter: Secondary | ICD-10-CM | POA: Diagnosis not present

## 2022-05-04 DIAGNOSIS — N200 Calculus of kidney: Secondary | ICD-10-CM | POA: Diagnosis not present

## 2022-05-04 LAB — COMPREHENSIVE METABOLIC PANEL
ALT: 32 U/L (ref 0–44)
AST: 26 U/L (ref 15–41)
Albumin: 4.9 g/dL (ref 3.5–5.0)
Alkaline Phosphatase: 53 U/L (ref 38–126)
Anion gap: 10 (ref 5–15)
BUN: 20 mg/dL (ref 6–20)
CO2: 24 mmol/L (ref 22–32)
Calcium: 10 mg/dL (ref 8.9–10.3)
Chloride: 98 mmol/L (ref 98–111)
Creatinine, Ser: 0.88 mg/dL (ref 0.44–1.00)
GFR, Estimated: >60 mL/min (ref >60)
Glucose, Bld: 120 mg/dL — ABNORMAL HIGH (ref 70–99)
Potassium: 4.3 mmol/L (ref 3.5–5.1)
Sodium: 132 mmol/L — ABNORMAL LOW (ref 135–145)
Total Bilirubin: 0.4 mg/dL (ref 0.3–1.2)
Total Protein: 8 g/dL (ref 6.5–8.1)

## 2022-05-04 LAB — URINALYSIS, ROUTINE W REFLEX MICROSCOPIC
Bacteria, UA: NONE SEEN
Bilirubin Urine: NEGATIVE
Glucose, UA: NEGATIVE mg/dL
Leukocytes,Ua: NEGATIVE
Nitrite: NEGATIVE
Protein, ur: 30 mg/dL — AB
RBC / HPF: >50 RBC/hpf (ref 0–5)
Specific Gravity, Urine: 1.033 — ABNORMAL HIGH (ref 1.005–1.030)
pH: 5.5 (ref 5.0–8.0)

## 2022-05-04 LAB — CBC
HCT: 43.1 % (ref 36.0–46.0)
Hemoglobin: 14.3 g/dL (ref 12.0–15.0)
MCH: 30.1 pg (ref 26.0–34.0)
MCHC: 33.2 g/dL (ref 30.0–36.0)
MCV: 90.7 fL (ref 80.0–100.0)
Platelets: 308 10*3/uL (ref 150–400)
RBC: 4.75 MIL/uL (ref 3.87–5.11)
RDW: 12.7 % (ref 11.5–15.5)
WBC: 10.3 10*3/uL (ref 4.0–10.5)
nRBC: 0 % (ref 0.0–0.2)

## 2022-05-04 LAB — LIPASE, BLOOD: Lipase: 16 U/L (ref 11–51)

## 2022-05-04 LAB — PREGNANCY, URINE: Preg Test, Ur: NEGATIVE

## 2022-05-04 MED ORDER — IOHEXOL 300 MG/ML  SOLN: 100.0000 mL | Freq: Once | INTRAMUSCULAR | Status: DC | PRN

## 2022-05-04 MED ORDER — ONDANSETRON HCL 4 MG PO TABS: 4.0000 mg | ORAL_TABLET | Freq: Three times a day (TID) | ORAL | 0 refills | Status: AC | PRN

## 2022-05-04 NOTE — ED Triage Notes (Signed)
Patient here POV from Home.  Endorses since 1600, some Lower ABD Pain and Lower Back Pain.   History of Renal Stones. Some Nausea. 2 Episodes of Emesis. No Known Fevers. No Diarrhea. Mostly Left Flank.   NAD Noted during Triage. A&Ox4. CGS 15. Ambulatory.

## 2022-05-04 NOTE — ED Provider Notes (Signed)
[facility] Provider Note   CSN: [id number] Arrival date & time: 05/04/22  1939     History  Chief Complaint  Patient presents with   Abdominal Pain    Stephanie Mcclure is a 52 y.o. female with PMH kidney stones presents to ED c/o sudden onset of left flank pain this afternoon around 4pm with nausea, vomiting, and hematuria. She has had 3 episodes of kidney stones before that have required lithotripsy and surgical removal due to obstruction. She reports not feeling as if she was emptying her bladder earlier this afternoon but no fever, chills, dysuria, chest pain, shortness of breath, LE pain or swelling, headache, hematemesis, or other complaints. On initial exam, pt reports shortly after being brought to main ED she had an episode where she went to the restroom, vomited, urinated, and had sudden relief of her pain and all symptoms. Currently, she is symptom free.       Home Medications Prior to Admission medications   Medication Sig Start Date End Date Taking? Authorizing Provider  ondansetron (ZOFRAN) 4 MG tablet Take 1 tablet (4 mg total) by mouth every 8 (eight) hours as needed for up to 3 days for nausea or vomiting. 05/04/22 05/07/22 Yes Nekeisha Aure L, PA-C  estradiol (VIVELLE-DOT) 0.05 MG/24HR patch Place 1 patch (0.05 mg total) onto the skin 2 (two) times a week. 08/05/21   Chrzanowski, Jami B, NP  ibuprofen (ADVIL,MOTRIN) 200 MG tablet Take 200-400 mg by mouth every 6 (six) hours as needed for moderate pain.    [provider]  levocetirizine (XYZAL) 5 MG tablet Take 5 mg by mouth daily as needed for allergies.    [provider]  levonorgestrel (MIRENA) 20 MCG/24HR IUD 1 each by Intrauterine route once. INSERTED May 09 2017    [provider]  naproxen sodium (ALEVE) 220 MG tablet Take 220 mg by mouth daily as needed (pain). Patient not taking: Reported on 06/02/2021    [provider]       Allergies    Patient has no known allergies.    Review of Systems   Review of Systems  All other systems reviewed and are negative.   Physical Exam Updated Vital Signs BP 123/70   Pulse 73   Temp 98 F (36.7 C)   Resp 15   Ht '5\' 5"'$  (1.651 m)   Wt 70.3 kg   SpO2 98%   BMI 25.79 kg/m  Physical Exam Vitals and nursing note reviewed.  Constitutional:      General: She is not in acute distress.    Appearance: Normal appearance. She is not ill-appearing, toxic-appearing or diaphoretic.  HENT:     Head: Normocephalic and atraumatic.     Mouth/Throat:     Mouth: Mucous membranes are moist.  Eyes:     General: No scleral icterus.    Extraocular Movements: Extraocular movements intact.     Conjunctiva/sclera: Conjunctivae normal.  Cardiovascular:     Rate and Rhythm: Normal rate and regular rhythm.     Heart sounds: No murmur heard. Pulmonary:     Effort: Pulmonary effort is normal.     Breath sounds: Normal breath sounds.  Abdominal:     General: Abdomen is flat. There is no distension.     Palpations: Abdomen is soft. There is no shifting dullness, fluid wave, hepatomegaly, splenomegaly, mass or pulsatile mass.     Tenderness: There is no abdominal tenderness. There is left CVA tenderness (  minimal). There is no right CVA tenderness, guarding or rebound. Negative signs include Murphy's sign, Rovsing's sign, McBurney's sign, psoas sign and obturator sign.  Musculoskeletal:        General: Normal range of motion.     Cervical back: Neck supple.     Right lower leg: No edema.     Left lower leg: No edema.  Skin:    General: Skin is warm and dry.     Capillary Refill: Capillary refill takes less than 2 seconds.  Neurological:     Mental Status: She is alert. Mental status is at baseline.  Psychiatric:        Behavior: Behavior normal.     ED Results / Procedures / Treatments   Labs (all labs ordered are listed, but only abnormal results are displayed) Labs Reviewed   COMPREHENSIVE METABOLIC PANEL - Abnormal; Notable for the following components:      Result Value   Sodium 132 (*)    Glucose, Bld 120 (*)    All other components within normal limits  URINALYSIS, ROUTINE W REFLEX MICROSCOPIC - Abnormal; Notable for the following components:   APPearance HAZY (*)    Specific Gravity, Urine 1.033 (*)    Hgb urine dipstick LARGE (*)    Ketones, ur TRACE (*)    Protein, ur 30 (*)    All other components within normal limits  LIPASE, BLOOD  CBC  PREGNANCY, URINE    EKG None  Radiology CT ABDOMEN PELVIS WO CONTRAST  Result Date: 05/04/2022 CLINICAL DATA:  Lower abdominal pain. EXAM: CT ABDOMEN AND PELVIS WITHOUT CONTRAST TECHNIQUE: Multidetector CT imaging of the abdomen and pelvis was performed following the standard protocol without IV contrast. RADIATION DOSE REDUCTION: This exam was performed according to the departmental dose-optimization program which includes automated exposure control, adjustment of the mA and/or kV according to patient size and/or use of iterative reconstruction technique. COMPARISON:  May 27, 2017 FINDINGS: Lower chest: No acute abnormality. Hepatobiliary: No focal liver abnormality is seen. No gallstones, gallbladder wall thickening, or biliary dilatation. Pancreas: Unremarkable. No pancreatic ductal dilatation or surrounding inflammatory changes. Spleen: Normal in size without focal abnormality. Adrenals/Urinary Tract: Adrenal glands are unremarkable. Kidneys are normal in size. A 5 mm diameter cyst is seen within the posterior aspect of the mid right kidney. 4 mm and 5 mm parenchymal calcifications are seen within the posterolateral aspect of the upper pole of the left kidney. A 2 mm nonobstructing renal calculus is also seen within the lower pole of the left kidney. There is mild left-sided hydronephrosis and hydroureter without evidence of obstructing renal calculi. A 4 mm calcification is seen within the dependent portion of the  urinary bladder, medial to the left UVJ. The bladder is otherwise unremarkable. Stomach/Bowel: Stomach is within normal limits. Appendix appears normal. No evidence of bowel wall thickening, distention, or inflammatory changes. Vascular/Lymphatic: No significant vascular findings are present. No enlarged abdominal or pelvic lymph nodes. Reproductive: A properly positioned IUD is seen within an otherwise normal appearing uterus. The bilateral adnexa are unremarkable. Other: No abdominal wall hernia or abnormality. No abdominopelvic ascites. Musculoskeletal: No acute or significant osseous findings. IMPRESSION: 1. 4 mm recently passed left renal calculus within the dependent portion of the urinary bladder. 2. 2 mm nonobstructing left renal calculus. 3. Properly positioned IUD within an otherwise normal appearing uterus. Electronically Signed   By: Virgina Norfolk M.D.   On: 05/04/2022 23:03    Procedures Procedures   Medications Ordered in  ED Medications - No data to display  ED Course/ Medical Decision Making/ A&P                           Medical Decision Making Amount and/or Complexity of Data Reviewed Labs: ordered. Decision-making details documented in ED Course. Radiology: ordered. Decision-making details documented in ED Course.  Risk Prescription drug management.   Medical Decision Making:   Cha Lockette Reeder is a 52 y.o. female who presented to the ED today with abdominal pain, detailed above.    Additional history discussed with patient's family/caregivers.  Patient's presentation is complicated by their history of kidney stones.  Complete initial physical exam performed, notably the patient  was in no acute distress with soft, nontender abdomen without peritoneal signs. She did have very minimal L CVA tenderness. She did not appear dehydrated. She was neurologically intact.     Reviewed and confirmed nursing documentation for past medical history, family history, social history.     Initial Assessment:   With the patient's presentation of abdominal pain, most likely diagnosis is ureterolithiasis. Other diagnoses were considered including (but not limited to) gastroenteritis, colitis, small bowel obstruction, appendicitis, cholecystitis, pancreatitis, nephrolithiasis, UTI, pyelonephritis, PID, ovarian torsion. These are considered less likely due to history of present illness and physical exam findings.   This is most consistent with an acute complicated illness   Initial Plan:  CBC/CMP to evaluate for underlying infectious/metabolic etiology for patient's abdominal pain  Lipase to evaluate for pancreatitis  CT Ab/pelvis without contrast due to favored nephrolithiasis over GI etiology for patient's abdominal pain  Urinalysis and repeat physical assessment to evaluate for UTI/Pyelonpehritis  Empiric management of symptoms with escalating pain control and antiemetics as needed. Pt declined these on initial and repeat examinations due to resolution of pain.   Initial Study Results:   Laboratory  All laboratory results reviewed without evidence of clinically relevant pathology.   Exceptions include: Na 132, UA with positive Hgb and ketones   Radiology All images reviewed independently. Agree with radiology report at this time.   CT ABDOMEN PELVIS WO CONTRAST  Result Date: 05/04/2022 CLINICAL DATA:  Lower abdominal pain. EXAM: CT ABDOMEN AND PELVIS WITHOUT CONTRAST TECHNIQUE: Multidetector CT imaging of the abdomen and pelvis was performed following the standard protocol without IV contrast. RADIATION DOSE REDUCTION: This exam was performed according to the departmental dose-optimization program which includes automated exposure control, adjustment of the mA and/or kV according to patient size and/or use of iterative reconstruction technique. COMPARISON:  May 27, 2017 FINDINGS: Lower chest: No acute abnormality. Hepatobiliary: No focal liver abnormality is seen. No  gallstones, gallbladder wall thickening, or biliary dilatation. Pancreas: Unremarkable. No pancreatic ductal dilatation or surrounding inflammatory changes. Spleen: Normal in size without focal abnormality. Adrenals/Urinary Tract: Adrenal glands are unremarkable. Kidneys are normal in size. A 5 mm diameter cyst is seen within the posterior aspect of the mid right kidney. 4 mm and 5 mm parenchymal calcifications are seen within the posterolateral aspect of the upper pole of the left kidney. A 2 mm nonobstructing renal calculus is also seen within the lower pole of the left kidney. There is mild left-sided hydronephrosis and hydroureter without evidence of obstructing renal calculi. A 4 mm calcification is seen within the dependent portion of the urinary bladder, medial to the left UVJ. The bladder is otherwise unremarkable. Stomach/Bowel: Stomach is within normal limits. Appendix appears normal. No evidence of bowel wall thickening, distention, or inflammatory  changes. Vascular/Lymphatic: No significant vascular findings are present. No enlarged abdominal or pelvic lymph nodes. Reproductive: A properly positioned IUD is seen within an otherwise normal appearing uterus. The bilateral adnexa are unremarkable. Other: No abdominal wall hernia or abnormality. No abdominopelvic ascites. Musculoskeletal: No acute or significant osseous findings. IMPRESSION: 1. 4 mm recently passed left renal calculus within the dependent portion of the urinary bladder. 2. 2 mm nonobstructing left renal calculus. 3. Properly positioned IUD within an otherwise normal appearing uterus. Electronically Signed   By: Virgina Norfolk M.D.   On: 05/04/2022 23:03    Final Reassessment and Plan:   This is a 52 year old female with PMH kidney stones presenting to ED with acute left flank pain, nausea, and vomiting this afternoon. It has been several years since pt's last kidney stone but she has required lithotripsy and removal in the past.  Shortly after arriving in ED, pt reports going to bathroom, vomiting, urinating, and having relief of all of her pain and symptoms. On my initial exam, pt's vital signs are stable and she has soft, nontender abdomen. Very minimal L CVA tenderness but otherwise pt has benign physical exam and does not appear clinically dehydrated. No fever or other recent infectious symptoms. Overall, lab work reassuring. UA does show hematuria and ketonuria. CTAP obtained for further assessment of symptoms and shows 63m calculus in the bladder. On multiple reassessments, pt continues to be pain free, tolerating PO, and without complaints. Discussed all findings with pt/husband. Pt unsure if she has had previous clinic evaluation by urology for kidney ultrasounds and/or other workup for evaluation of recurrent kidney stones so will provide this follow up. Discussed discharge plan and strict ED return precautions with pt/husband who expressed understanding. Small amount of Zofran provided for symptomatic relief at home as needed. All questions answered and pt stable for discharge.      Final Clinical Impression(s) / ED Diagnoses Final diagnoses:  Left ureteral stone    Rx / DC Orders ED Discharge Orders          Ordered    ondansetron (ZOFRAN) 4 MG tablet  Every 8 hours PRN        05/04/22 2343              GSuzzette Righter PA-C 05/06/22 1408    TWyvonnia Dusky MD 05/06/22 [phone]

## 2022-05-04 NOTE — Discharge Instructions (Signed)
Thank you for letting us take care of you today.  Your CT scan showed that you passed the kidney stone.  The rest of your workup was reassuring and your pain had resolved so we believe you are safe to be discharged home at this time.  With your history of recurrent kidney stones, I do want you to follow-up with urology.  They may want to have an ultrasound of your kidneys or further testing to see why you get kidney stone so frequently and/or ways that you may prevent them.  I sent in a prescription for nausea medication should you need this.  If you develop recurrent symptoms, worsening abdominal pain, fever, uncontrollable vomiting, chest pain, shortness of breath, inability to urinate, or any other new or concerning symptoms, please return to nearest emergency department for re-evaluation.

## 2022-05-05 NOTE — ED Notes (Signed)
Patient's parents verbalizes understanding of discharge instructions. Opportunity for questioning and answers were provided. Armband removed by staff, pt discharged from ED. Ambulated out to lobby with parents

## 2022-06-07 ENCOUNTER — Other Ambulatory Visit (HOSPITAL_COMMUNITY)
Admission: RE | Admit: 2022-06-07 | Discharge: 2022-06-07 | Disposition: A | Discharge status: Home / Self Care | Payer: BLUE CROSS/BLUE SHIELD | Source: Ambulatory Visit | Attending: Radiology | Admitting: Radiology

## 2022-06-07 ENCOUNTER — Encounter: Payer: Self-pay | Admitting: Radiology

## 2022-06-07 ENCOUNTER — Ambulatory Visit (INDEPENDENT_AMBULATORY_CARE_PROVIDER_SITE_OTHER): Payer: BLUE CROSS/BLUE SHIELD | Admitting: Radiology

## 2022-06-07 VITALS — BP 112/78 | Ht 64.5 in | Wt 162.0 lb

## 2022-06-07 DIAGNOSIS — Z01419 Encounter for gynecological examination (general) (routine) without abnormal findings: Secondary | ICD-10-CM | POA: Insufficient documentation

## 2022-06-07 DIAGNOSIS — Z79890 Hormone replacement therapy (postmenopausal): Secondary | ICD-10-CM | POA: Diagnosis not present

## 2022-06-07 MED ORDER — ESTRADIOL 0.075 MG/24HR TD PTTW: 1.0000 | MEDICATED_PATCH | TRANSDERMAL | 4 refills | Status: DC

## 2022-06-07 NOTE — Progress Notes (Signed)
Stephanie Mcclure 1970-04-02 [id number]   History: Postmenopausal 52 y.o. presents for annual exam. Doing well on estradiol patch and Mirena for local progesterone. Interested in increasing dose to help with fatigue. No other gyn concerns.    Gynecologic History Postmenopausal Last Pap: 2021. Results were: normal Last mammogram: 08/31/21. Results were: normal Last colonoscopy: never HRT use: current patch and IUD  Obstetric History OB History  Gravida Para Term Preterm AB Living  2 2       2   SAB IAB Ectopic Multiple Live Births          2    # Outcome Date GA Lbr Len/2nd Weight Sex Delivery Anes PTL Lv  2 Para           1 Para              The following portions of the patient's history were reviewed and updated as appropriate: allergies, current medications, past family history, past medical history, past social history, past surgical history, and problem list.  Review of Systems Pertinent items noted in HPI and remainder of comprehensive ROS otherwise negative.  Past medical history, past surgical history, family history and social history were all reviewed and documented in the EPIC chart.  Exam:  Vitals:   06/07/22 1322  BP: 112/78  Weight: 162 lb (73.5 kg)  Height: 5' 4.5" (1.638 m)   Body mass index is 27.38 kg/m.  General appearance:  Normal Thyroid:  Symmetrical, normal in size, without palpable masses or nodularity. Respiratory  Auscultation:  Clear without wheezing or rhonchi Cardiovascular  Auscultation:  Regular rate, without rubs, murmurs or gallops  Edema/varicosities:  Not grossly evident Abdominal  Soft,nontender, without masses, guarding or rebound.  Liver/spleen:  No organomegaly noted  Hernia:  None appreciated  Skin  Inspection:  Grossly normal Breasts: Examined lying and sitting.   Right: Without masses, retractions, nipple discharge or axillary adenopathy.   Left: Without masses, retractions, nipple discharge or axillary  adenopathy. Genitourinary   Inguinal/mons:  Normal without inguinal adenopathy  External genitalia:  Normal appearing vulva with no masses, tenderness, or lesions  BUS/Urethra/Skene's glands:  Normal  Vagina:  Normal appearing with normal color and discharge, no lesions. Atrophy: none   Cervix:  Normal appearing without discharge or lesions, IUD strings seen in os  Uterus:  Normal in size, shape and contour.  Midline and mobile, nontender  Adnexa/parametria:     Rt: Normal in size, without masses or tenderness.   Lt: Normal in size, without masses or tenderness.  Anus and perineum: Normal    Patient informed chaperone available to be present for breast and pelvic exam. Patient has requested no chaperone to be present. Patient has been advised what will be completed during breast and pelvic exam.   Assessment/Plan:   1. Well woman exam with routine gynecological exam - Mammo due 08/2022 - Due for colonoscopy- will have PCP refer, declines cologuard - Cytology - PAP( [place])  2. Hormone replacement therapy (HRT) Mirena good until 3/ 2027 Will increase the patch to see if fatigue and vasomotor symptoms improve - estradiol (VIVELLE-DOT) 0.075 MG/24HR; Place 1 patch onto the skin 2 (two) times a week.  Dispense: 24 patch; Refill: 4    Discussed SBE, colonoscopy and DEXA screening as directed. Recommend 140mins of exercise weekly, including weight bearing exercise. Encouraged the use of seatbelts and sunscreen.  Return in 1 year for annual or sooner prn.  Taeko Schaffer B WHNP-BC, 2:16 PM 06/07/2022

## 2022-06-09 LAB — CYTOLOGY - PAP
Comment: NEGATIVE
Diagnosis: NEGATIVE
High risk HPV: NEGATIVE

## 2022-07-18 ENCOUNTER — Other Ambulatory Visit: Payer: Self-pay | Admitting: Radiology

## 2022-07-18 DIAGNOSIS — Z1231 Encounter for screening mammogram for malignant neoplasm of breast: Secondary | ICD-10-CM

## 2022-09-02 ENCOUNTER — Ambulatory Visit
Admission: RE | Admit: 2022-09-02 | Discharge: 2022-09-02 | Disposition: A | Discharge status: Home / Self Care | Payer: BLUE CROSS/BLUE SHIELD | Source: Ambulatory Visit | Attending: Radiology | Admitting: Radiology

## 2022-09-02 DIAGNOSIS — Z1231 Encounter for screening mammogram for malignant neoplasm of breast: Secondary | ICD-10-CM | POA: Diagnosis not present

## 2022-09-06 NOTE — Telephone Encounter (Signed)
Correct, thanks.

## 2022-09-15 DIAGNOSIS — Z0000 Encounter for general adult medical examination without abnormal findings: Secondary | ICD-10-CM | POA: Diagnosis not present

## 2022-09-19 DIAGNOSIS — Z0000 Encounter for general adult medical examination without abnormal findings: Secondary | ICD-10-CM | POA: Diagnosis not present

## 2022-09-19 DIAGNOSIS — Z1331 Encounter for screening for depression: Secondary | ICD-10-CM | POA: Diagnosis not present

## 2022-09-19 DIAGNOSIS — R82998 Other abnormal findings in urine: Secondary | ICD-10-CM | POA: Diagnosis not present

## 2022-09-19 DIAGNOSIS — Z1339 Encounter for screening examination for other mental health and behavioral disorders: Secondary | ICD-10-CM | POA: Diagnosis not present

## 2022-09-19 DIAGNOSIS — M533 Sacrococcygeal disorders, not elsewhere classified: Secondary | ICD-10-CM | POA: Diagnosis not present

## 2022-09-19 DIAGNOSIS — Z23 Encounter for immunization: Secondary | ICD-10-CM | POA: Diagnosis not present

## 2022-11-28 ENCOUNTER — Ambulatory Visit (AMBULATORY_SURGERY_CENTER): Payer: BLUE CROSS/BLUE SHIELD

## 2022-11-28 ENCOUNTER — Other Ambulatory Visit (HOSPITAL_COMMUNITY): Payer: Self-pay

## 2022-11-28 VITALS — Ht 64.5 in | Wt 162.0 lb

## 2022-11-28 DIAGNOSIS — Z1211 Encounter for screening for malignant neoplasm of colon: Secondary | ICD-10-CM

## 2022-11-28 MED ORDER — NA SULFATE-K SULFATE-MG SULF 17.5-3.13-1.6 GM/177ML PO SOLN
1.0000 | Freq: Once | ORAL | 0 refills | Status: DC
  Filled 2022-11-28: qty 354, 1d supply, fill #0

## 2022-11-28 NOTE — Progress Notes (Signed)
No egg or soy allergy known to patient  No issues known to pt with past sedation with any surgeries or procedures Patient denies ever being told they had issues or difficulty with intubation  No FH of Malignant Hyperthermia Pt is not on diet pills Pt is not on  home 02  Pt is not on blood thinners  Pt denies issues with constipation  No A fib or A flutter Have any cardiac testing pending-- no LOA: independent  Prep: suprep   Patient's chart reviewed by Cathlyn Parsons CNRA prior to previsit and patient appropriate for the [facility].  Previsit completed and red dot placed by patient's name on their procedure day (on provider's schedule).     PV competed with patient. Prep instructions sent via mychart. Hard copy given at pre-visit apt.

## 2022-11-29 ENCOUNTER — Other Ambulatory Visit: Payer: Self-pay

## 2022-11-29 ENCOUNTER — Telehealth: Payer: Self-pay | Admitting: Gastroenterology

## 2022-11-29 DIAGNOSIS — Z1211 Encounter for screening for malignant neoplasm of colon: Secondary | ICD-10-CM

## 2022-11-29 MED ORDER — NA SULFATE-K SULFATE-MG SULF 17.5-3.13-1.6 GM/177ML PO SOLN: 1.0000 | Freq: Once | ORAL | 0 refills | Status: AC

## 2022-11-29 NOTE — Telephone Encounter (Signed)
Rx sent to requested pharmacy VM left making patient aware.

## 2022-11-29 NOTE — Telephone Encounter (Signed)
PT is calling to have prep medications sent to [facility] [address]. Please advise.

## 2022-12-05 ENCOUNTER — Other Ambulatory Visit (HOSPITAL_COMMUNITY): Payer: Self-pay

## 2022-12-07 ENCOUNTER — Encounter: Payer: Self-pay | Admitting: Gastroenterology

## 2022-12-20 ENCOUNTER — Ambulatory Visit (AMBULATORY_SURGERY_CENTER): Payer: BLUE CROSS/BLUE SHIELD | Admitting: Gastroenterology

## 2022-12-20 ENCOUNTER — Encounter: Payer: Self-pay | Admitting: Gastroenterology

## 2022-12-20 VITALS — BP 103/71 | HR 81 | Temp 98.9°F | Resp 15 | Ht 64.5 in | Wt 166.0 lb

## 2022-12-20 DIAGNOSIS — Z1211 Encounter for screening for malignant neoplasm of colon: Secondary | ICD-10-CM

## 2022-12-20 MED ORDER — SODIUM CHLORIDE 0.9 % IV SOLN: 500.0000 mL | Freq: Once | INTRAVENOUS | Status: DC

## 2022-12-20 NOTE — Patient Instructions (Signed)
Thank you for letting us take care of your healthcare needs today! Handouts given on hemorrhoids.  YOU HAD AN ENDOSCOPIC PROCEDURE TODAY AT THE [facility]:   Refer to the procedure report that was given to you for any specific questions about what was found during the examination.  If the procedure report does not answer your questions, please call your gastroenterologist to clarify.  If you requested that your care partner not be given the details of your procedure findings, then the procedure report has been included in a sealed envelope for you to review at your convenience later.  YOU SHOULD EXPECT: Some feelings of bloating in the abdomen. Passage of more gas than usual.  Walking can help get rid of the air that was put into your GI tract during the procedure and reduce the bloating. If you had a lower endoscopy (such as a colonoscopy or flexible sigmoidoscopy) you may notice spotting of blood in your stool or on the toilet paper. If you underwent a bowel prep for your procedure, you may not have a normal bowel movement for a few days.  Please Note:  You might notice some irritation and congestion in your nose or some drainage.  This is from the oxygen used during your procedure.  There is no need for concern and it should clear up in a day or so.  SYMPTOMS TO REPORT IMMEDIATELY:  Following lower endoscopy (colonoscopy or flexible sigmoidoscopy):  Excessive amounts of blood in the stool  Significant tenderness or worsening of abdominal pains  Swelling of the abdomen that is new, acute  Fever of 100F or higher   For urgent or emergent issues, a gastroenterologist can be reached at any hour by calling [phone]. Do not use MyChart messaging for urgent concerns.    DIET:  We do recommend a small meal at first, but then you may proceed to your regular diet.  Drink plenty of fluids but you should avoid alcoholic beverages for 24 hours.  ACTIVITY:  You should plan to take it  easy for the rest of today and you should NOT DRIVE or use heavy machinery until tomorrow (because of the sedation medicines used during the test).    FOLLOW UP: Our staff will call the number listed on your records the next business day following your procedure.  We will call around 7:15- 8:00 am to check on you and address any questions or concerns that you may have regarding the information given to you following your procedure. If we do not reach you, we will leave a message.     If any biopsies were taken you will be contacted by phone or by letter within the next 1-3 weeks.  Please call us at [phone] if you have not heard about the biopsies in 3 weeks.    SIGNATURES/CONFIDENTIALITY: You and/or your care partner have signed paperwork which will be entered into your electronic medical record.  These signatures attest to the fact that that the information above on your After Visit Summary has been reviewed and is understood.  Full responsibility of the confidentiality of this discharge information lies with you and/or your care-partner.

## 2022-12-20 NOTE — Op Note (Signed)
[facility] Patient Name: Stephanie Mcclure Procedure Date: 12/20/2022 7:33 AM MRN: [id number] Endoscopist: Lynann Bologna , MD, [id number] Age: 52 Referring MD:  Date of Birth: 02/24/1971 Gender: Female Account #: [id number] Procedure:                Colonoscopy Indications:              Screening for colorectal malignant neoplasm Medicines:                Monitored Anesthesia Care Procedure:                Pre-Anesthesia Assessment:                           - Prior to the procedure, a History and Physical                            was performed, and patient medications and                            allergies were reviewed. The patient's tolerance of                            previous anesthesia was also reviewed. The risks                            and benefits of the procedure and the sedation                            options and risks were discussed with the patient.                            All questions were answered, and informed consent                            was obtained. Prior Anticoagulants: The patient has                            taken no anticoagulant or antiplatelet agents. ASA                            Grade Assessment: I - A normal, healthy patient.                            After reviewing the risks and benefits, the patient                            was deemed in satisfactory condition to undergo the                            procedure.                           After obtaining informed consent, the colonoscope  was passed under direct vision. Throughout the                            procedure, the patient's blood pressure, pulse, and                            oxygen saturations were monitored continuously. The                            [id number]-HQ190L Colonoscope [id number] was introduced                            through the anus and advanced to the the cecum,                            identified by appendiceal  orifice and ileocecal                            valve. The colonoscopy was performed without                            difficulty. The patient tolerated the procedure                            well. The quality of the bowel preparation was                            good. The ileocecal valve, appendiceal orifice, and                            rectum were photographed. Scope In: 8:44:24 AM Scope Out: 8:59:16 AM Scope Withdrawal Time: 0 hours 9 minutes 8 seconds  Total Procedure Duration: 0 hours 14 minutes 52 seconds  Findings:                 The colon (entire examined portion) appeared normal.                           Non-bleeding internal hemorrhoids were found during                            retroflexion. The hemorrhoids were small and Grade                            I (internal hemorrhoids that do not prolapse).                           The exam was otherwise without abnormality on                            direct and retroflexion views. Complications:            No immediate complications. Estimated Blood Loss:     Estimated blood loss: none. Impression:               - The  entire examined colon is normal.                           - Non-bleeding internal hemorrhoids.                           - The examination was otherwise normal on direct                            and retroflexion views.                           - No specimens collected. Recommendation:           - Patient has a contact number available for                            emergencies. The signs and symptoms of potential                            delayed complications were discussed with the                            patient. Return to normal activities tomorrow.                            Written discharge instructions were provided to the                            patient.                           - Resume previous diet.                           - Continue present medications.                            - Repeat colonoscopy in 10 years for screening                            purposes. Earlier, if with any new problems or                            change in family history.                           - The findings and recommendations were discussed                            with the patient's family. Lynann Bologna, MD 12/20/2022 9:03:39 AM This report has been signed electronically.

## 2022-12-20 NOTE — Progress Notes (Deleted)
Chief Complaint:   Referring Provider:  Cleatis Polka., MD      ASSESSMENT AND PLAN;   #1.   #2.    HPI:    Stephanie Mcclure is a 52 y.o. female    Past Medical History:  Diagnosis Date   History of kidney stones 2004, 2011, 05/2022    Past Surgical History:  Procedure Laterality Date   CYSTOSCOPY W/ URETEROSCOPY     2004   CYSTOSCOPY WITH RETROGRADE PYELOGRAM, URETEROSCOPY AND STENT PLACEMENT Left 05/31/2017   Procedure: CYSTOSCOPY WITH LEFT RETROGRADE PYELOGRAM, URETEROSCOPY AND STENT PLACEMENT;  Surgeon: Crista Elliot, MD;  Location: [facility];  Service: Urology;  Laterality: Left;   EXTRACORPOREAL SHOCK WAVE LITHOTRIPSY  2011   HOLMIUM LASER APPLICATION Left 05/31/2017   Procedure: HOLMIUM LASER APPLICATION;  Surgeon: Crista Elliot, MD;  Location: [facility];  Service: Urology;  Laterality: Left;    Family History  Problem Relation Age of Onset   Heart disease Father    Colon cancer Neg Hx    Colon polyps Neg Hx    Esophageal cancer Neg Hx    Rectal cancer Neg Hx    Stomach cancer Neg Hx     Social History   Tobacco Use   Smoking status: Never    Passive exposure: Never   Smokeless tobacco: Never  Vaping Use   Vaping status: Never Used  Substance Use Topics   Alcohol use: Yes    Alcohol/week: 4.0 standard drinks of alcohol    Types: 4 Standard drinks or equivalent per week    Comment: occasional   Drug use: No    Current Outpatient Medications  Medication Sig Dispense Refill   Cholecalciferol (VITAMIN D-3 PO) Take 5,000 Units by mouth daily.     estradiol (VIVELLE-DOT) 0.075 MG/24HR Place 1 patch onto the skin 2 (two) times a week. 24 patch 4   levocetirizine (XYZAL) 5 MG tablet Take 5 mg by mouth daily as needed for allergies. Alternates between allegra, claritin     levonorgestrel (MIRENA) 20 MCG/24HR IUD 1 each by Intrauterine route once. INSERTED May 09 2017     MAGNESIUM PO Take 200 mg by  mouth daily.     ibuprofen (ADVIL,MOTRIN) 200 MG tablet Take 200-400 mg by mouth every 6 (six) hours as needed for moderate pain.     Current Facility-Administered Medications  Medication Dose Route Frequency Provider Last Rate Last Admin   0.9 %  sodium chloride infusion  500 mL Intravenous Once Lynann Bologna, MD        No Known Allergies  Review of Systems:  Constitutional: Denies fever, chills, diaphoresis, appetite change and fatigue.  HEENT: Denies photophobia, eye pain, redness, hearing loss, ear pain, congestion, sore throat, rhinorrhea, sneezing, mouth sores, neck pain, neck stiffness and tinnitus.   Respiratory: Denies SOB, DOE, cough, chest tightness,  and wheezing.   Cardiovascular: Denies chest pain, palpitations and leg swelling.  Genitourinary: Denies dysuria, urgency, frequency, hematuria, flank pain and difficulty urinating.  Musculoskeletal: Denies myalgias, back pain, joint swelling, arthralgias and gait problem.  Skin: No rash.  Neurological: Denies dizziness, seizures, syncope, weakness, light-headedness, numbness and headaches.  Hematological: Denies adenopathy. Easy bruising, personal or family bleeding history  Psychiatric/Behavioral: No anxiety or depression     Physical Exam:    BP 113/74   Pulse 79   Temp 98.9 F (37.2 C) (Temporal)   Ht 5' 4.5" (1.638 m)  Wt 166 lb (75.3 kg)   SpO2 97%   BMI 28.05 kg/m  Wt Readings from Last 3 Encounters:  12/20/22 166 lb (75.3 kg)  11/28/22 162 lb (73.5 kg)  06/07/22 162 lb (73.5 kg)   Constitutional:  Well-developed, in no acute distress. Psychiatric: Normal mood and affect. Behavior is normal. HEENT: Pupils normal.  Conjunctivae are normal. No scleral icterus. Neck supple.  Cardiovascular: Normal rate, regular rhythm. No edema Pulmonary/chest: Effort normal and breath sounds normal. No wheezing, rales or rhonchi. Abdominal: Soft, nondistended. Nontender. Bowel sounds active throughout. There are no masses  palpable. No hepatomegaly. Rectal: Deferred Neurological: Alert and oriented to person place and time. Skin: Skin is warm and dry. No rashes noted.  Data Reviewed: I have personally reviewed following labs and imaging studies  CBC:    Latest Ref Rng & Units 05/04/2022    7:49 PM 05/26/2017   11:58 PM 05/26/2017   11:50 PM  CBC  WBC 4.0 - 10.5 K/uL 10.3   13.0   Hemoglobin 12.0 - 15.0 g/dL 27.2  53.6  64.4   Hematocrit 36.0 - 46.0 % 43.1  44.0  42.2   Platelets 150 - 400 K/uL 308   326     CMP:    Latest Ref Rng & Units 05/04/2022    7:49 PM 06/02/2021    9:48 AM 05/26/2017   11:58 PM  CMP  Glucose 70 - 99 mg/dL 034  91  742   BUN 6 - 20 mg/dL 20  21  11    Creatinine 0.44 - 1.00 mg/dL 5.95  6.38  7.56   Sodium 135 - 145 mmol/L 132  137  136   Potassium 3.5 - 5.1 mmol/L 4.3  4.4  3.7   Chloride 98 - 111 mmol/L 98  103  99   CO2 22 - 32 mmol/L 24  27    Calcium 8.9 - 10.3 mg/dL 43.3  9.3    Total Protein 6.5 - 8.1 g/dL 8.0  7.1    Total Bilirubin 0.3 - 1.2 mg/dL 0.4  0.5    Alkaline Phos 38 - 126 U/L 53     AST 15 - 41 U/L 26  17    ALT 0 - 44 U/L 32  15      GFR: CrCl cannot be calculated (Patient's most recent lab result is older than the maximum 21 days allowed.). Liver Function Tests: No results for input(s): "AST", "ALT", "ALKPHOS", "BILITOT", "PROT", "ALBUMIN" in the last 168 hours. No results for input(s): "LIPASE", "AMYLASE" in the last 168 hours. No results for input(s): "AMMONIA" in the last 168 hours. Coagulation Profile: No results for input(s): "INR", "PROTIME" in the last 168 hours. HbA1C: No results for input(s): "HGBA1C" in the last 72 hours. Lipid Profile: No results for input(s): "CHOL", "HDL", "LDLCALC", "TRIG", "CHOLHDL", "LDLDIRECT" in the last 72 hours. Thyroid Function Tests: No results for input(s): "TSH", "T4TOTAL", "FREET4", "T3FREE", "THYROIDAB" in the last 72 hours. Anemia Panel: No results for input(s): "VITAMINB12", "FOLATE", "FERRITIN",  "TIBC", "IRON", "RETICCTPCT" in the last 72 hours.  No results found for this or any previous visit (from the past 240 hour(s)).    Radiology Studies: No results found.    Edman Circle, MD 12/20/2022, 8:39 AM  Cc: Cleatis Polka., MD

## 2022-12-20 NOTE — Progress Notes (Signed)
[facility] History and Physical   Primary Care Physician:  Cleatis Polka., MD   Reason for Procedure:   [facility] screening  Plan:     colonoscopy     HPI: Stephanie Mcclure is a 52 y.o. female    Past Medical History:  Diagnosis Date   History of kidney stones 2004, 2011, 05/2022    Past Surgical History:  Procedure Laterality Date   CYSTOSCOPY W/ URETEROSCOPY     2004   CYSTOSCOPY WITH RETROGRADE PYELOGRAM, URETEROSCOPY AND STENT PLACEMENT Left 05/31/2017   Procedure: CYSTOSCOPY WITH LEFT RETROGRADE PYELOGRAM, URETEROSCOPY AND STENT PLACEMENT;  Surgeon: Crista Elliot, MD;  Location: [facility];  Service: Urology;  Laterality: Left;   EXTRACORPOREAL SHOCK WAVE LITHOTRIPSY  2011   HOLMIUM LASER APPLICATION Left 05/31/2017   Procedure: HOLMIUM LASER APPLICATION;  Surgeon: Crista Elliot, MD;  Location: [facility];  Service: Urology;  Laterality: Left;    Prior to Admission medications   Medication Sig Start Date End Date Taking? Authorizing Provider  Cholecalciferol (VITAMIN D-3 PO) Take 5,000 Units by mouth daily.   Yes [provider]  estradiol (VIVELLE-DOT) 0.075 MG/24HR Place 1 patch onto the skin 2 (two) times a week. 06/09/22  Yes Chrzanowski, Jami B, NP  levocetirizine (XYZAL) 5 MG tablet Take 5 mg by mouth daily as needed for allergies. Alternates between allegra, claritin   Yes [provider]  levonorgestrel (MIRENA) 20 MCG/24HR IUD 1 each by Intrauterine route once. INSERTED May 09 2017   Yes [provider]  MAGNESIUM PO Take 200 mg by mouth daily.   Yes [provider]  ibuprofen (ADVIL,MOTRIN) 200 MG tablet Take 200-400 mg by mouth every 6 (six) hours as needed for moderate pain.    [provider]    Current Outpatient Medications  Medication Sig Dispense Refill   Cholecalciferol (VITAMIN D-3 PO) Take 5,000 Units by mouth daily.     estradiol (VIVELLE-DOT) 0.075  MG/24HR Place 1 patch onto the skin 2 (two) times a week. 24 patch 4   levocetirizine (XYZAL) 5 MG tablet Take 5 mg by mouth daily as needed for allergies. Alternates between allegra, claritin     levonorgestrel (MIRENA) 20 MCG/24HR IUD 1 each by Intrauterine route once. INSERTED May 09 2017     MAGNESIUM PO Take 200 mg by mouth daily.     ibuprofen (ADVIL,MOTRIN) 200 MG tablet Take 200-400 mg by mouth every 6 (six) hours as needed for moderate pain.     Current Facility-Administered Medications  Medication Dose Route Frequency Provider Last Rate Last Admin   0.9 %  sodium chloride infusion  500 mL Intravenous Once Lynann Bologna, MD        Allergies as of 12/20/2022   (No Known Allergies)    Family History  Problem Relation Age of Onset   Heart disease Father    Colon cancer Neg Hx    Colon polyps Neg Hx    Esophageal cancer Neg Hx    Rectal cancer Neg Hx    Stomach cancer Neg Hx     Social History   Socioeconomic History   Marital status: Married    Spouse name: Not on file   Number of children: Not on file   Years of education: Not on file   Highest education level: Not on file  Occupational History   Not on file  Tobacco Use   Smoking status: Never  Passive exposure: Never   Smokeless tobacco: Never  Vaping Use   Vaping status: Never Used  Substance and Sexual Activity   Alcohol use: Yes    Alcohol/week: 4.0 standard drinks of alcohol    Types: 4 Standard drinks or equivalent per week    Comment: occasional   Drug use: No   Sexual activity: Yes    Partners: Male    Birth control/protection: I.U.D., Post-menopausal    Comment: menarche 52yo, sexual debut 52yo  Other Topics Concern   Not on file  Social History Narrative   Not on file   Social Determinants of Health   Financial Resource Strain: Not on file  Food Insecurity: Not on file  Transportation Needs: Not on file  Physical Activity: Not on file  Stress: Not on file  Social Connections: Not on  file  Intimate Partner Violence: Not on file    Review of Systems: Positive for none All other review of systems negative except as mentioned in the HPI.  Physical Exam: Vital signs in last 24 hours: @VSRANGES @   General:   Alert,  Well-developed, well-nourished, pleasant and cooperative in NAD Lungs:  Clear throughout to auscultation.   Heart:  Regular rate and rhythm; no murmurs, clicks, rubs,  or gallops. Abdomen:  Soft, nontender and nondistended. Normal bowel sounds.   Neuro/Psych:  Alert and cooperative. Normal mood and affect. A and O x 3    No significant changes were identified.  The patient continues to be an appropriate candidate for the planned procedure and anesthesia.   Edman Circle, MD. [facility] 12/20/2022 9:04 AM@

## 2022-12-20 NOTE — Progress Notes (Signed)
Report to PACU, RN, vss, BBS= Clear.  

## 2022-12-21 ENCOUNTER — Telehealth: Payer: Self-pay

## 2022-12-21 NOTE — Telephone Encounter (Signed)
Post procedure follow up call, no answer 

## 2023-06-14 ENCOUNTER — Encounter: Payer: Self-pay | Admitting: Radiology

## 2023-06-14 ENCOUNTER — Ambulatory Visit: Payer: BLUE CROSS/BLUE SHIELD | Admitting: Radiology

## 2023-06-14 VITALS — BP 104/68 | HR 74 | Ht 64.25 in | Wt 161.4 lb

## 2023-06-14 DIAGNOSIS — Z79890 Hormone replacement therapy (postmenopausal): Secondary | ICD-10-CM | POA: Diagnosis not present

## 2023-06-14 DIAGNOSIS — Z1331 Encounter for screening for depression: Secondary | ICD-10-CM

## 2023-06-14 DIAGNOSIS — Z01419 Encounter for gynecological examination (general) (routine) without abnormal findings: Secondary | ICD-10-CM

## 2023-06-14 MED ORDER — ESTRADIOL 0.1 MG/24HR TD PTTW
1.0000 | MEDICATED_PATCH | TRANSDERMAL | 4 refills | Status: AC
Start: 2023-06-15 — End: ?

## 2023-06-14 NOTE — Progress Notes (Signed)
 Stephanie Mcclure 18-Jul-1970 [id number]   History:  53 y.o. G2P2 presents for annual exam. Doing well on HRT, would like to increase for protective benefits. No new gyn concerns. Mirena good until 2027. Up to date on screenings, has a PCP. Regular exercise.  Gynecologic History No LMP recorded. Patient is postmenopausal.   Contraception/Family planning: IUD Sexually active: yes Last Pap: 2024. Results were: normal Last mammogram: 08/2022. Results were: normal  Obstetric History OB History  Gravida Para Term Preterm AB Living  2 2    2   SAB IAB Ectopic Multiple Live Births      2    # Outcome Date GA Lbr Len/2nd Weight Sex Type Anes PTL Lv  2 Para           1 Para                06/14/2023    9:05 AM  Depression screen PHQ 2/9  Decreased Interest 0  Down, Depressed, Hopeless 0  PHQ - 2 Score 0     The following portions of the patient's history were reviewed and updated as appropriate: allergies, current medications, past family history, past medical history, past social history, past surgical history, and problem list.  Review of Systems  Constitutional: Negative.   Cardiovascular: Negative.   Gastrointestinal:  Negative for abdominal pain, blood in stool, constipation, nausea and vomiting.  Genitourinary: Negative.  Negative for dysuria, flank pain, frequency, hematuria and urgency.  Endo/Heme/Allergies:  Does not bruise/bleed easily.  Psychiatric/Behavioral: Negative.  Negative for depression, memory loss and substance abuse. The patient is not nervous/anxious and does not have insomnia.     Past medical history, past surgical history, family history and social history were all reviewed and documented in the EPIC chart.  Exam:  Vitals:   06/14/23 0907  BP: 104/68  Pulse: 74  SpO2: 96%  Weight: 161 lb 6.4 oz (73.2 kg)  Height: 5' 4.25" (1.632 m)   Body mass index is 27.49 kg/m.  Physical Exam Vitals and nursing note reviewed. Exam conducted with a chaperone  present.  Constitutional:      Appearance: Normal appearance. She is normal weight.  HENT:     Head: Normocephalic and atraumatic.  Neck:     Thyroid: No thyroid mass, thyromegaly or thyroid tenderness.  Cardiovascular:     Rate and Rhythm: Regular rhythm.     Heart sounds: Normal heart sounds.  Pulmonary:     Effort: Pulmonary effort is normal.     Breath sounds: Normal breath sounds.  Chest:  Breasts:    Breasts are symmetrical.     Right: Normal. No inverted nipple, mass, nipple discharge, skin change or tenderness.     Left: Normal. No inverted nipple, mass, nipple discharge, skin change or tenderness.  Abdominal:     General: Abdomen is flat. Bowel sounds are normal.     Palpations: Abdomen is soft.  Genitourinary:    General: Normal vulva.     Vagina: Normal. No vaginal discharge, bleeding or lesions.     Cervix: Normal. No discharge or lesion.     Uterus: Normal. Not enlarged and not tender.      Adnexa: Right adnexa normal and left adnexa normal.       Right: No mass, tenderness or fullness.         Left: No mass, tenderness or fullness.    Lymphadenopathy:     Upper Body:     Right upper body: No axillary  adenopathy.     Left upper body: No axillary adenopathy.  Skin:    General: Skin is warm and dry.  Neurological:     Mental Status: She is alert and oriented to person, place, and time.  Psychiatric:        Mood and Affect: Mood normal.        Thought Content: Thought content normal.        Judgment: Judgment normal.      Raynelle Fanning, CMA present for exam  Assessment/Plan:   1. Well woman exam with routine gynecological exam (Primary) Pap 2027  2. Hormone replacement therapy (HRT) Mirena may remain until 2027 If any bleeding will add micronized progesterone - estradiol (VIVELLE-DOT) 0.1 MG/24HR patch; Place 1 patch (0.1 mg total) onto the skin 2 (two) times a week.  Dispense: 24 patch; Refill: 4    Discussed SBE, colonoscopy and DEXA screening as  directed/appropriate. Recommend of exercise weekly, including weight bearing exercise. Encouraged the use of seatbelts and sunscreen.  Return in about 1 year (around 06/13/2024) for Annual.  Tanda Rockers WHNP-BC 10:35 AM 06/14/2023

## 2023-06-14 NOTE — Patient Instructions (Signed)
 Preventive Care 16-53 Years Old, Female  Preventive care refers to lifestyle choices and visits with your health care provider that can promote health and wellness. Preventive care visits are also called wellness exams.  What can I expect for my preventive care visit?  Counseling  Your health care provider may ask you questions about your:  Medical history, including:  Past medical problems.  Family medical history.  Pregnancy history.  Current health, including:  Menstrual cycle.  Method of birth control.  Emotional well-being.  Home life and relationship well-being.  Sexual activity and sexual health.  Lifestyle, including:  Alcohol, nicotine or tobacco, and drug use.  Access to firearms.  Diet, exercise, and sleep habits.  Work and work Astron[employer].  Sunscreen use.  Safety issues such as seatbelt and bike helmet use.  Physical exam  Your health care provider will check your:  Height and weight. These may be used to calculate your BMI (body mass index). BMI is a measurement that tells if you are at a healthy weight.  Waist circumference. This measures the distance around your waistline. This measurement also tells if you are at a healthy weight and may help predict your risk of certain diseases, such as type 2 diabetes and high blood pressure.  Heart rate and blood pressure.  Body temperature.  Skin for abnormal spots.  What immunizations do I need?    Vaccines are usually given at various ages, according to a schedule. Your health care provider will recommend vaccines for you based on your age, medical history, and lifestyle or other factors, such as travel or where you work.  What tests do I need?  Screening  Your health care provider may recommend screening tests for certain conditions. This may include:  Lipid and cholesterol levels.  Diabetes screening. This is done by checking your blood sugar (glucose) after you have not eaten for a while (fasting).  Pelvic exam and Pap test.  Hepatitis B test.  Hepatitis C  test.  HIV (human immunodeficiency virus) test.  STI (sexually transmitted infection) testing, if you are at risk.  Lung cancer screening.  Colorectal cancer screening.  Mammogram. Talk with your health care provider about when you should start having regular mammograms. This may depend on whether you have a family history of breast cancer.  BRCA-related cancer screening. This may be done if you have a family history of breast, ovarian, tubal, or peritoneal cancers.  Bone density scan. This is done to screen for osteoporosis.  Talk with your health care provider about your test results, treatment options, and if necessary, the need for more tests.  Follow these instructions at home:  Eating and drinking    Eat a diet that includes fresh fruits and vegetables, whole grains, lean protein, and low-fat dairy products.  Take vitamin and mineral supplements as recommended by your health care provider.  Do not drink alcohol if:  Your health care provider tells you not to drink.  You are pregnant, may be pregnant, or are planning to become pregnant.  If you drink alcohol:  Limit how much you have to 0-1 drink a day.  Know how much alcohol is in your drink. In the [place] one drink equals one 12 oz bottle of beer (355 mL), one 5 oz glass of wine (148 mL), or one 1 oz glass of hard liquor (44 mL).  Lifestyle  Brush your teeth every morning and night with fluoride toothpaste. Floss one time each day.  Exercise for at least  30 minutes 5 or more days each week.  Do not use any products that contain nicotine or tobacco. These products include cigarettes, chewing tobacco, and vaping devices, such as e-cigarettes. If you need help quitting, ask your health care provider.  Do not use drugs.  If you are sexually active, practice safe sex. Use a condom or other form of protection to prevent STIs.  If you do not wish to become pregnant, use a form of birth control. If you plan to become pregnant, see your health care provider for a  prepregnancy visit.  Take aspirin only as told by your health care provider. Make sure that you understand how much to take and what form to take. Work with your health care provider to find out whether it is safe and beneficial for you to take aspirin daily.  Find healthy ways to manage stress, such as:  Meditation, yoga, or listening to music.  Journaling.  Talking to a trusted person.  Spending time with friends and family.  Minimize exposure to UV radiation to reduce your risk of skin cancer.  Safety  Always wear your seat belt while driving or riding in a vehicle.  Do not drive:  If you have been drinking alcohol. Do not ride with someone who has been drinking.  When you are tired or distracted.  While texting.  If you have been using any mind-altering substances or drugs.  Wear a helmet and other protective equipment during sports activities.  If you have firearms in your house, make sure you follow all gun safety procedures.  Seek help if you have been physically or sexually abused.  What's next?  Visit your health care provider once a year for an annual wellness visit.  Ask your health care provider how often you should have your eyes and teeth checked.  Stay up to date on all vaccines.  This information is not intended to replace advice given to you by your health care provider. Make sure you discuss any questions you have with your health care provider.  Document Revised: 08/19/2020 Document Reviewed: 08/19/2020  Elsevier Patient Education  2024 [employer].

## 2024-06-18 ENCOUNTER — Ambulatory Visit: Admitting: Radiology
